# Patient Record
Sex: Female | Born: 1990 | Race: White | Hispanic: No | Marital: Single | Smoking: Current every day smoker
Health system: Southern US, Community
[De-identification: ages and names within clinical notes are randomized; demographics above are authoritative.]

## PROBLEM LIST (undated history)

## (undated) ENCOUNTER — Inpatient Hospital Stay: Payer: Self-pay

## (undated) DIAGNOSIS — F32A Depression, unspecified: Secondary | ICD-10-CM

## (undated) DIAGNOSIS — F1910 Other psychoactive substance abuse, uncomplicated: Secondary | ICD-10-CM

## (undated) DIAGNOSIS — T7840XA Allergy, unspecified, initial encounter: Secondary | ICD-10-CM

## (undated) DIAGNOSIS — D649 Anemia, unspecified: Secondary | ICD-10-CM

## (undated) DIAGNOSIS — Z789 Other specified health status: Secondary | ICD-10-CM

## (undated) DIAGNOSIS — R011 Cardiac murmur, unspecified: Secondary | ICD-10-CM

## (undated) DIAGNOSIS — F419 Anxiety disorder, unspecified: Secondary | ICD-10-CM

## (undated) HISTORY — DX: Depression, unspecified: F32.A

## (undated) HISTORY — DX: Allergy, unspecified, initial encounter: T78.40XA

## (undated) HISTORY — DX: Anxiety disorder, unspecified: F41.9

## (undated) HISTORY — DX: Anemia, unspecified: D64.9

## (undated) HISTORY — DX: Other psychoactive substance abuse, uncomplicated: F19.10

---
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
## 2011-05-13 ENCOUNTER — Ambulatory Visit: Payer: Self-pay | Admitting: Family Medicine

## 2012-12-29 ENCOUNTER — Emergency Department: Payer: Self-pay | Admitting: Emergency Medicine

## 2012-12-29 LAB — CBC
HGB: 14 g/dL (ref 12.0–16.0)
Platelet: 221 10*3/uL (ref 150–440)
RBC: 4.63 10*6/uL (ref 3.80–5.20)
RDW: 14.8 % — ABNORMAL HIGH (ref 11.5–14.5)

## 2012-12-29 LAB — COMPREHENSIVE METABOLIC PANEL
Albumin: 4.3 g/dL (ref 3.4–5.0)
Alkaline Phosphatase: 59 U/L (ref 50–136)
Anion Gap: 7 (ref 7–16)
BUN: 11 mg/dL (ref 7–18)
Calcium, Total: 9 mg/dL (ref 8.5–10.1)
Chloride: 107 mmol/L (ref 98–107)
Glucose: 111 mg/dL — ABNORMAL HIGH (ref 65–99)
Osmolality: 278 (ref 275–301)
Potassium: 4 mmol/L (ref 3.5–5.1)
SGPT (ALT): 19 U/L (ref 12–78)
Total Protein: 7.8 g/dL (ref 6.4–8.2)

## 2012-12-29 LAB — URINALYSIS, COMPLETE
Bacteria: NONE SEEN
Bilirubin,UR: NEGATIVE
Blood: NEGATIVE
Glucose,UR: NEGATIVE mg/dL (ref 0–75)
Ketone: NEGATIVE
Nitrite: NEGATIVE
Ph: 6 (ref 4.5–8.0)
RBC,UR: 1 /HPF (ref 0–5)
Squamous Epithelial: 3

## 2012-12-29 LAB — LIPASE, BLOOD: Lipase: 75 U/L (ref 73–393)

## 2013-04-17 ENCOUNTER — Encounter: Payer: Self-pay | Admitting: Maternal & Fetal Medicine

## 2013-04-24 ENCOUNTER — Emergency Department: Payer: Self-pay | Admitting: Unknown Physician Specialty

## 2013-04-24 LAB — CBC WITH DIFFERENTIAL/PLATELET
Eosinophil #: 0.2 10*3/uL (ref 0.0–0.7)
Eosinophil %: 1.9 %
HCT: 34.9 % — ABNORMAL LOW (ref 35.0–47.0)
HGB: 12.1 g/dL (ref 12.0–16.0)
Lymphocyte %: 19.9 %
Monocyte %: 6.1 %
Neutrophil #: 5.7 10*3/uL (ref 1.4–6.5)
Platelet: 163 10*3/uL (ref 150–440)
RBC: 3.77 10*6/uL — ABNORMAL LOW (ref 3.80–5.20)
RDW: 13.2 % (ref 11.5–14.5)

## 2013-04-24 LAB — COMPREHENSIVE METABOLIC PANEL
Alkaline Phosphatase: 40 U/L — ABNORMAL LOW (ref 50–136)
Anion Gap: 6 — ABNORMAL LOW (ref 7–16)
BUN: 7 mg/dL (ref 7–18)
Bilirubin,Total: 0.3 mg/dL (ref 0.2–1.0)
Calcium, Total: 8.5 mg/dL (ref 8.5–10.1)
Chloride: 108 mmol/L — ABNORMAL HIGH (ref 98–107)
Co2: 25 mmol/L (ref 21–32)
Creatinine: 0.5 mg/dL — ABNORMAL LOW (ref 0.60–1.30)
EGFR (African American): >60
EGFR (Non-African Amer.): >60
Glucose: 83 mg/dL (ref 65–99)
Osmolality: 275 (ref 275–301)
Potassium: 3.5 mmol/L (ref 3.5–5.1)
Sodium: 139 mmol/L (ref 136–145)

## 2013-04-24 LAB — URINALYSIS, COMPLETE
Bacteria: NONE SEEN
Blood: NEGATIVE
Glucose,UR: NEGATIVE mg/dL (ref 0–75)
Ketone: NEGATIVE
Nitrite: NEGATIVE
Ph: 7 (ref 4.5–8.0)
Protein: NEGATIVE
RBC,UR: <1 /HPF (ref 0–5)
Specific Gravity: 1.015 (ref 1.003–1.030)

## 2013-04-24 LAB — LIPASE, BLOOD: Lipase: 53 U/L — ABNORMAL LOW (ref 73–393)

## 2013-04-24 LAB — WET PREP, GENITAL

## 2013-05-29 ENCOUNTER — Encounter: Payer: Self-pay | Admitting: Maternal and Fetal Medicine

## 2013-07-25 ENCOUNTER — Observation Stay: Payer: Self-pay | Admitting: Advanced Practice Midwife

## 2013-07-25 LAB — URINALYSIS, COMPLETE
Bilirubin,UR: NEGATIVE
Glucose,UR: NEGATIVE mg/dL (ref 0–75)
Leukocyte Esterase: NEGATIVE
Nitrite: NEGATIVE
Ph: 7 (ref 4.5–8.0)
Protein: NEGATIVE
Specific Gravity: 1.012 (ref 1.003–1.030)

## 2013-10-25 ENCOUNTER — Observation Stay: Payer: Self-pay | Admitting: Obstetrics & Gynecology

## 2013-11-01 ENCOUNTER — Inpatient Hospital Stay: Payer: Self-pay

## 2013-11-01 LAB — GC/CHLAMYDIA PROBE AMP

## 2013-11-02 LAB — CBC WITH DIFFERENTIAL/PLATELET
BASOS ABS: 0.1 10*3/uL (ref 0.0–0.1)
Basophil %: 0.4 %
Eosinophil #: 0.1 10*3/uL (ref 0.0–0.7)
Eosinophil %: 0.7 %
HCT: 36 % (ref 35.0–47.0)
HGB: 12.3 g/dL (ref 12.0–16.0)
LYMPHS PCT: 13.7 %
Lymphocyte #: 2 10*3/uL (ref 1.0–3.6)
MCH: 31.7 pg (ref 26.0–34.0)
MCHC: 34.1 g/dL (ref 32.0–36.0)
MCV: 93 fL (ref 80–100)
MONO ABS: 0.9 x10 3/mm (ref 0.2–0.9)
Monocyte %: 6.1 %
Neutrophil #: 11.6 10*3/uL — ABNORMAL HIGH (ref 1.4–6.5)
Neutrophil %: 79.1 %
Platelet: 143 10*3/uL — ABNORMAL LOW (ref 150–440)
RBC: 3.87 10*6/uL (ref 3.80–5.20)
RDW: 13 % (ref 11.5–14.5)
WBC: 14.7 10*3/uL — AB (ref 3.6–11.0)

## 2013-11-04 LAB — HEMATOCRIT: HCT: 28.1 % — AB (ref 35.0–47.0)

## 2013-12-27 ENCOUNTER — Emergency Department: Payer: Self-pay | Admitting: Emergency Medicine

## 2013-12-28 LAB — URINALYSIS, COMPLETE
BACTERIA: NONE SEEN
Bilirubin,UR: NEGATIVE
Blood: NEGATIVE
Glucose,UR: NEGATIVE mg/dL (ref 0–75)
KETONE: NEGATIVE
NITRITE: NEGATIVE
Ph: 5 (ref 4.5–8.0)
Protein: NEGATIVE
RBC,UR: 2 /HPF (ref 0–5)
Specific Gravity: 1.027 (ref 1.003–1.030)
WBC UR: 2 /HPF (ref 0–5)

## 2013-12-28 LAB — COMPREHENSIVE METABOLIC PANEL
ALBUMIN: 3.8 g/dL (ref 3.4–5.0)
ALT: 17 U/L (ref 12–78)
ANION GAP: 6 — AB (ref 7–16)
Alkaline Phosphatase: 51 U/L
BILIRUBIN TOTAL: 0.2 mg/dL (ref 0.2–1.0)
BUN: 14 mg/dL (ref 7–18)
Calcium, Total: 9.2 mg/dL (ref 8.5–10.1)
Chloride: 109 mmol/L — ABNORMAL HIGH (ref 98–107)
Co2: 27 mmol/L (ref 21–32)
Creatinine: 1.08 mg/dL (ref 0.60–1.30)
EGFR (African American): >60
GLUCOSE: 88 mg/dL (ref 65–99)
Osmolality: 283 (ref 275–301)
Potassium: 3.9 mmol/L (ref 3.5–5.1)
SGOT(AST): 15 U/L (ref 15–37)
SODIUM: 142 mmol/L (ref 136–145)
TOTAL PROTEIN: 7.7 g/dL (ref 6.4–8.2)

## 2013-12-28 LAB — CBC WITH DIFFERENTIAL/PLATELET
BASOS PCT: 0.4 %
Basophil #: 0 10*3/uL (ref 0.0–0.1)
EOS ABS: 0.1 10*3/uL (ref 0.0–0.7)
Eosinophil %: 1.2 %
HCT: 40.1 % (ref 35.0–47.0)
HGB: 12.6 g/dL (ref 12.0–16.0)
LYMPHS ABS: 2.4 10*3/uL (ref 1.0–3.6)
Lymphocyte %: 21.7 %
MCH: 29 pg (ref 26.0–34.0)
MCHC: 31.3 g/dL — ABNORMAL LOW (ref 32.0–36.0)
MCV: 93 fL (ref 80–100)
MONOS PCT: 7.7 %
Monocyte #: 0.9 x10 3/mm (ref 0.2–0.9)
NEUTROS ABS: 7.8 10*3/uL — AB (ref 1.4–6.5)
Neutrophil %: 69 %
Platelet: 237 10*3/uL (ref 150–440)
RBC: 4.34 10*6/uL (ref 3.80–5.20)
RDW: 12.7 % (ref 11.5–14.5)
WBC: 11.2 10*3/uL — ABNORMAL HIGH (ref 3.6–11.0)

## 2013-12-28 LAB — LIPASE, BLOOD: Lipase: 84 U/L (ref 73–393)

## 2014-05-12 HISTORY — PX: WISDOM TOOTH EXTRACTION: SHX21

## 2015-01-06 IMAGING — US ABDOMEN ULTRASOUND LIMITED
1 series · 14 of 25 positions shown · non-contrast
Comparison: Abdominal ultrasound 05/13/2011.

CLINICAL DATA: Right upper quadrant pain and nausea.

EXAM:
US ABDOMEN LIMITED - RIGHT UPPER QUADRANT

[Series 1: abdomen ultrasound limited · 0.24mm/px · 14 of 35 slices shown]
[im 1/35]
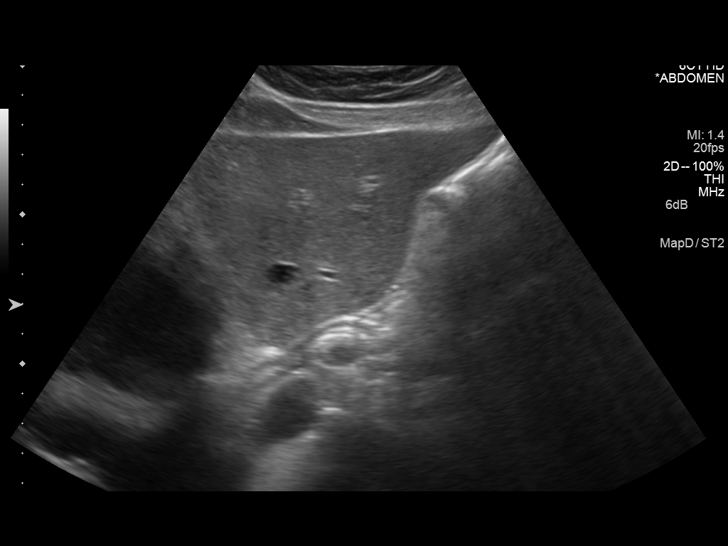
[im 3/35]
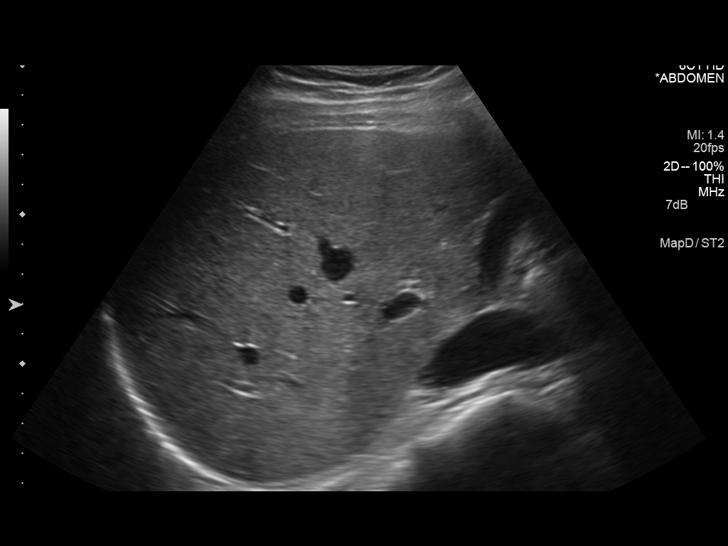
[im 6/35]
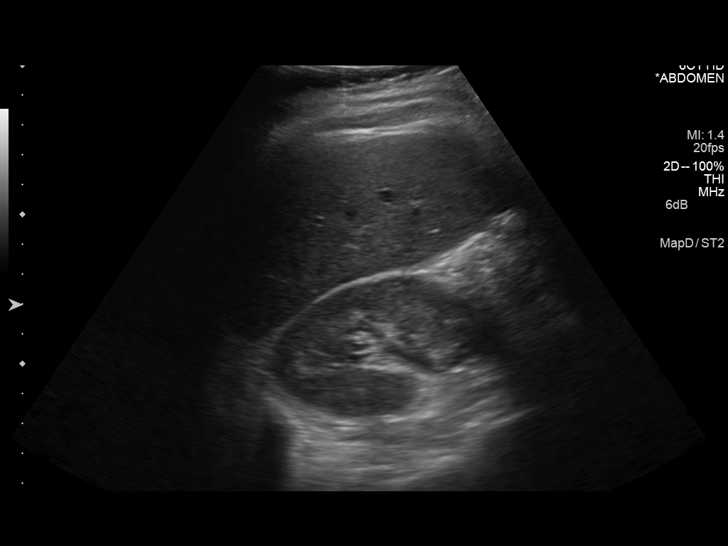
[im 9/35]
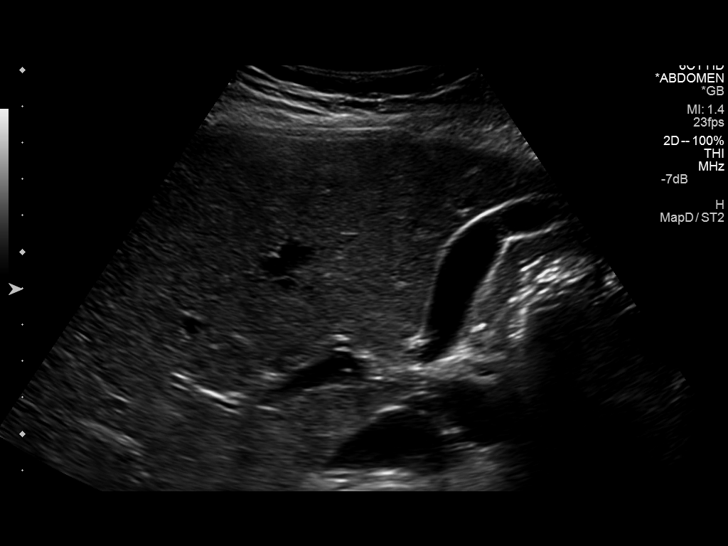
[im 12/35]
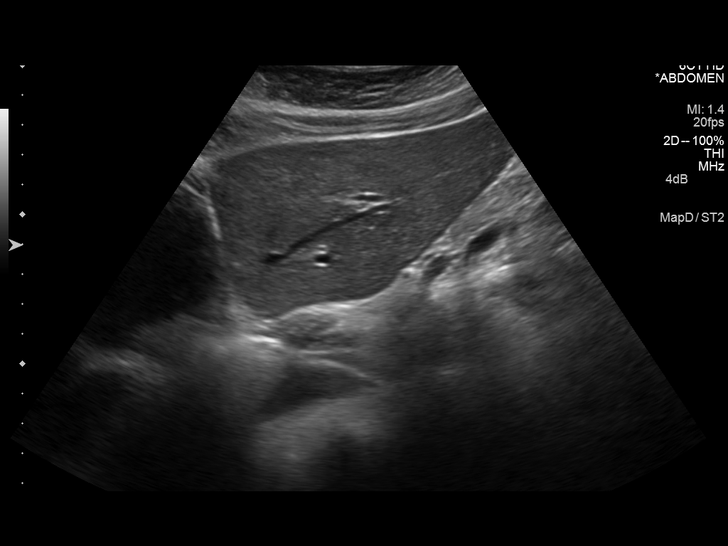
[im 13/35]
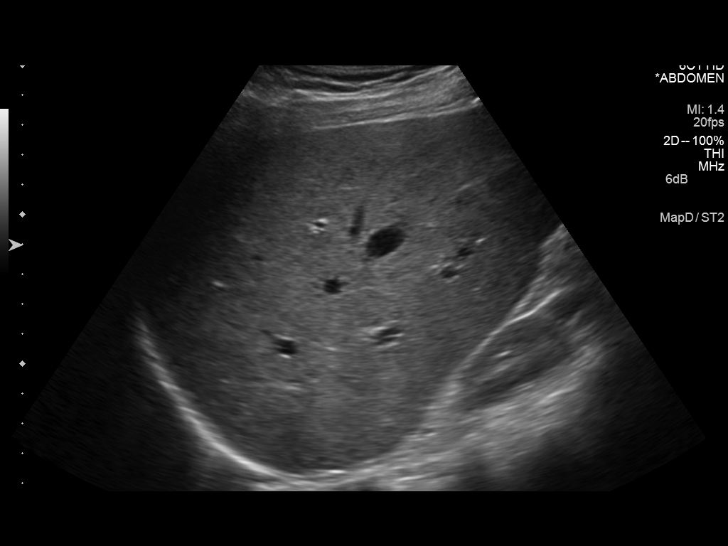
[im 16/35]
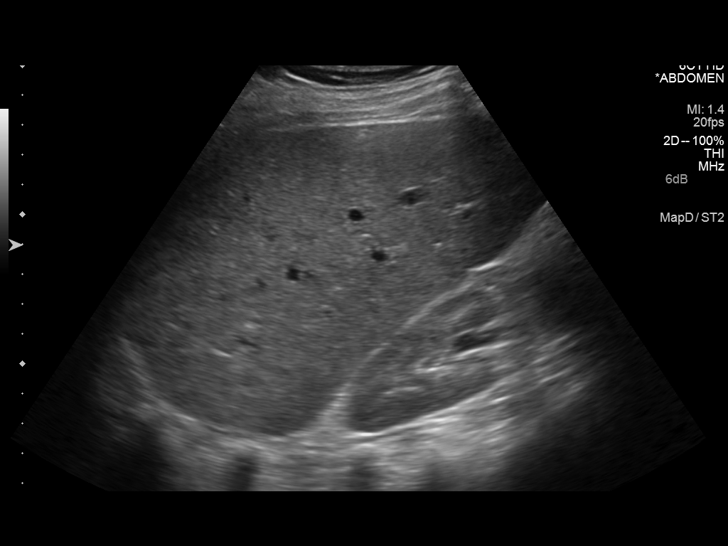
[im 19/35]
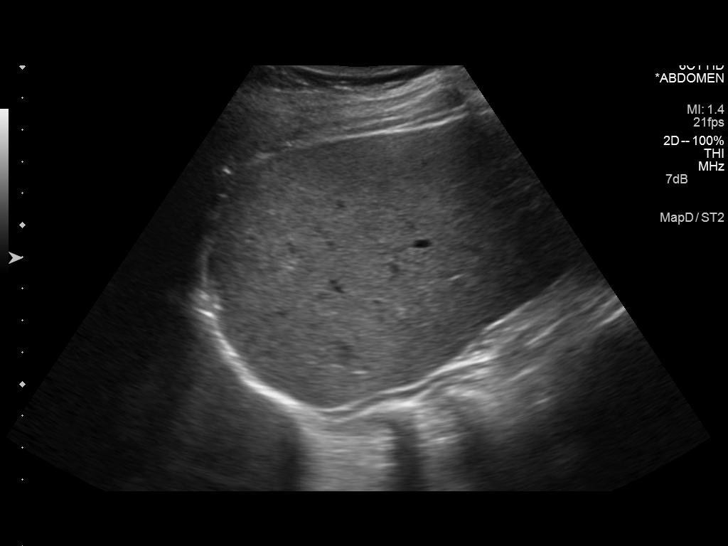
[im 22/35]
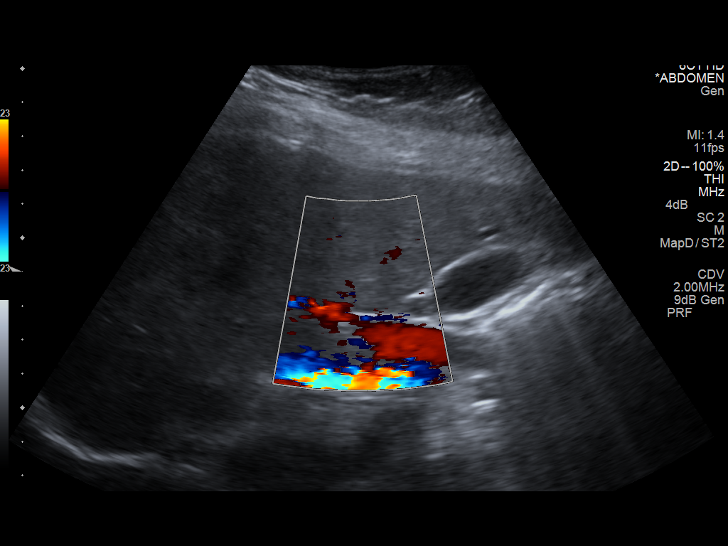
[im 23/35]
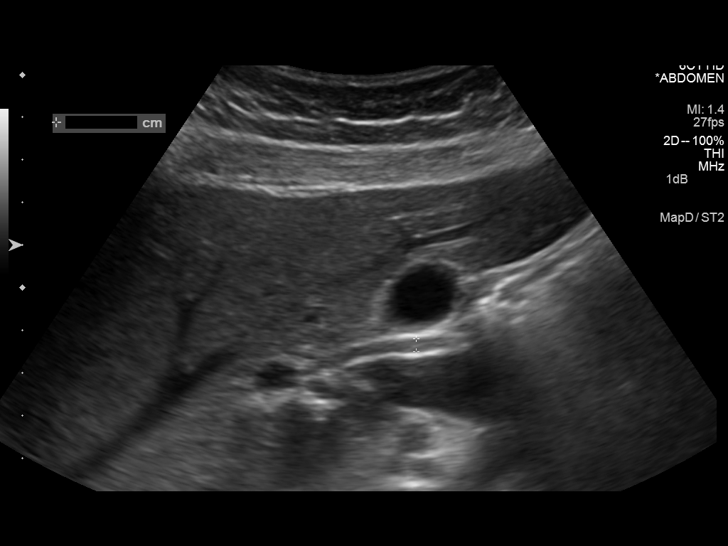
[im 26/35]
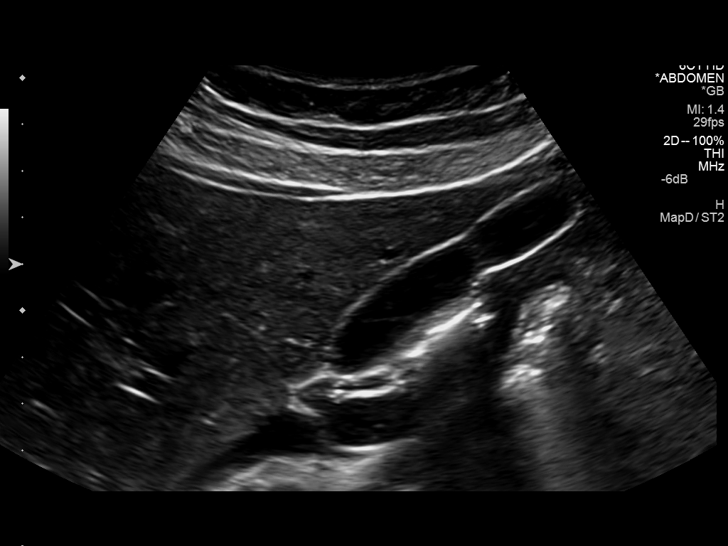
[im 29/35]
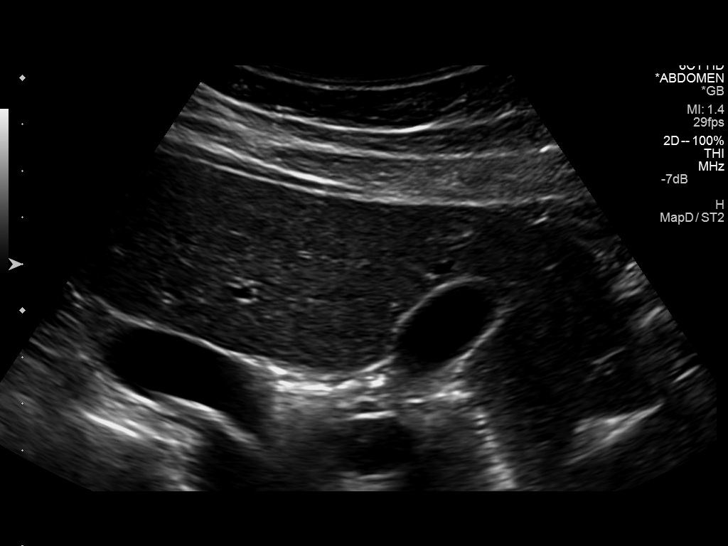
[im 32/35]
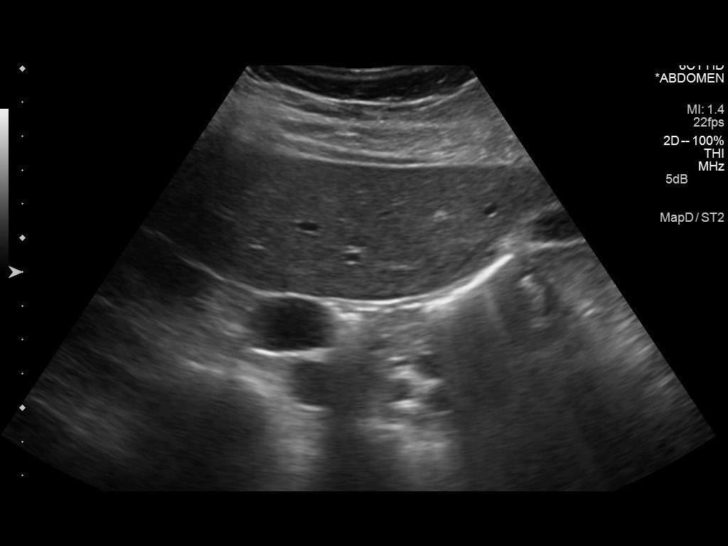
[im 35/35]
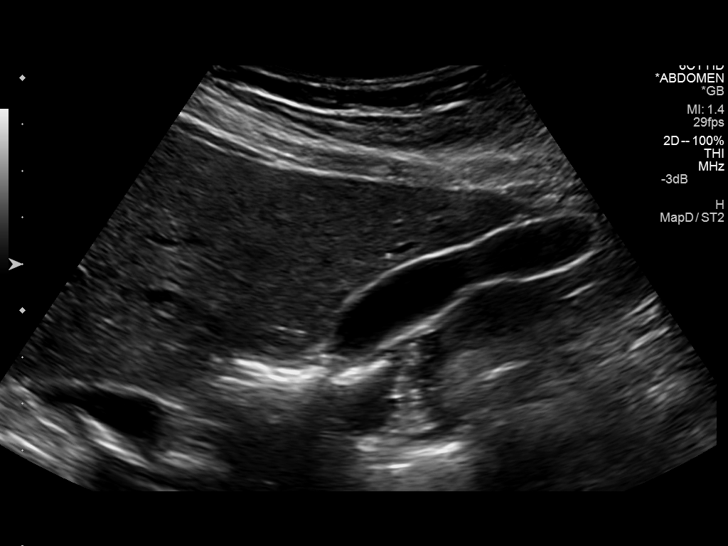

[14 of 25 positions shown; findings below may reference images not displayed]

FINDINGS: Gallbladder:

No gallstones or wall thickening visualized. No sonographic Murphy
sign noted.

Common bile duct:

Diameter: 0.3 cm

Liver:

No focal lesion identified. Within normal limits in parenchymal
echogenicity.
IMPRESSION: Negative for gallstones.  Negative examination.

## 2015-02-19 NOTE — H&P (Signed)
L&D Evaluation:  History:  HPI 24 year old G1 P0 with EDC=10/25/2013 by LMP=01/18/13 and confirmed by a 12wk2d ultrasound (EDC=10/28/2013) admitted for IOL at 41 weeks. Reports active FM. No VB or LOF. PNC at [facility] remarkable for early care, tobacco use (1/2 PPD), being RH negative (received Rhogam 08/04/2013, and more recently was diagnosed with polyhydraminos. Received TDAP 08/04/2013. LABS: A neg, VI, Rubella UTD, GBS negative.   Presents with IOL   Patient's Medical History No Chronic Illness   Patient's Surgical History none   Medications Pre Natal Vitamins  Tylenol (Acetaminophen)  TUMS   Allergies Sulfa, H2O2   Social History tobacco  1/2 PPD, used MJ prior to pregnancy.   Family History Non-Contributory   ROS:  ROS see HPI   Exam:  Vital Signs stable  129/71   Urine Protein not completed   General no apparent distress   Mental Status clear   Chest clear   Heart normal sinus rhythm, no murmur/gallop/rubs   Abdomen gravid, non-tender   Estimated Fetal Weight Average for gestational age   Fetal Position ROP   Edema no edema   Reflexes 2+   Pelvic no external lesions, 1/60%/-1 to -2   Mebranes Intact, AFI=25 cm   FHT normal rate with no decels, 150 baseline initially with accels to 170s   FHT Description Cat 1, reactive NST   Fetal Heart Rate 150   Ucx occ   Skin dry   Impression:  Impression IUP at 41 weeks with reactive NST and polyhydraminos   Plan:  Plan Discussed risks of induction vs risks of continued observation. Aware of risjks of hyperstimulation, FITL, failed induction,  C-section and needing serial induction.  Discussed use of Cervidil, in cervical ripening as well as Cytotec, intracervical foley, and Pitocin. patient and husband wish to proceed with IOL. Cervidil inserted PV.   Electronic Signatures: Trinna BalloonGutierrez, Aylan Bayona L (CNM)  (Signed 21-Jan-15 23:43)  Authored: L&D Evaluation   Last Updated: 21-Jan-15 23:43 by Trinna BalloonGutierrez, Carlitos Bottino  L (CNM)

## 2015-08-28 ENCOUNTER — Other Ambulatory Visit: Payer: Self-pay | Admitting: Advanced Practice Midwife

## 2015-08-28 DIAGNOSIS — Z369 Encounter for antenatal screening, unspecified: Secondary | ICD-10-CM

## 2015-09-23 ENCOUNTER — Ambulatory Visit (HOSPITAL_BASED_OUTPATIENT_CLINIC_OR_DEPARTMENT_OTHER)
Admission: RE | Admit: 2015-09-23 | Discharge: 2015-09-23 | Disposition: A | Discharge status: Home / Self Care | Payer: Medicaid Other | Source: Ambulatory Visit | Attending: Maternal & Fetal Medicine | Admitting: Maternal & Fetal Medicine

## 2015-09-23 ENCOUNTER — Ambulatory Visit
Admission: RE | Admit: 2015-09-23 | Discharge: 2015-09-23 | Disposition: A | Discharge status: Home / Self Care | Payer: Medicaid Other | Source: Ambulatory Visit | Attending: Maternal & Fetal Medicine | Admitting: Maternal & Fetal Medicine

## 2015-09-23 DIAGNOSIS — Z3A12 12 weeks gestation of pregnancy: Secondary | ICD-10-CM | POA: Diagnosis not present

## 2015-09-23 DIAGNOSIS — Z369 Encounter for antenatal screening, unspecified: Secondary | ICD-10-CM | POA: Insufficient documentation

## 2015-09-23 DIAGNOSIS — Z36 Encounter for antenatal screening of mother: Secondary | ICD-10-CM

## 2015-09-23 HISTORY — DX: Other specified health status: Z78.9

## 2015-09-23 NOTE — Progress Notes (Addendum)
Referring physician:  [facility] Department Length of Consultation: 30 minutes   Pamela [name]. Pamela [name]  was referred to [facility] for genetic counseling to review prenatal screening and testing options.  This note summarizes the information we discussed.    We offered the following routine screening tests for this pregnancy:  First trimester screening, which includes nuchal translucency ultrasound screen and first trimester maternal serum marker screening.  The nuchal translucency has approximately an 80% detection rate for Down syndrome and can be positive for other chromosome abnormalities as well as congenital heart defects.  When combined with a maternal serum marker screening, the detection rate is up to 90% for Down syndrome and up to 97% for trisomy 18.     Maternal serum marker screening, a blood test that measures pregnancy proteins, can provide risk assessments for Down syndrome, trisomy 18, and open neural tube defects (spina bifida, anencephaly). Because it does not directly examine the fetus, it cannot positively diagnose or rule out these problems.  Targeted ultrasound uses high frequency sound waves to create an image of the developing fetus.  An ultrasound is often recommended as a routine means of evaluating the pregnancy.  It is also used to screen for fetal anatomy problems (for example, a heart defect) that might be suggestive of a chromosomal or other abnormality.   Should these screening tests indicate an increased concern, then the following additional testing options would be offered:  The chorionic villus sampling procedure is available for first trimester chromosome analysis.  This involves the withdrawal of a small amount of chorionic villi (tissue from the developing placenta).  Risk of pregnancy loss is estimated to be approximately 1 in 200 to 1 in 100 (0.5 to 1%).  There is approximately a 1% (1 in 100) chance that the CVS chromosome results will  be unclear.  Chorionic villi cannot be tested for neural tube defects.     Amniocentesis involves the removal of a small amount of amniotic fluid from the sac surrounding the fetus with the use of a thin needle inserted through the maternal abdomen and uterus.  Ultrasound guidance is used throughout the procedure.  Fetal cells from amniotic fluid are directly evaluated and > 99.5% of chromosome problems and > 98% of open neural tube defects can be detected. This procedure is generally performed after the 15th week of pregnancy.  The main risks to this procedure include complications leading to miscarriage in less than 1 in 200 cases (0.5%).  As another option for information if the pregnancy is suspected to be an an increased chance for certain chromosome conditions, we also reviewed the availability of cell free fetal DNA testing from maternal blood to determine whether or not the baby may have either Down syndrome, trisomy 79, or trisomy 84.  This test utilizes a maternal blood sample and DNA sequencing technology to isolate circulating cell free fetal DNA from maternal plasma.  The fetal DNA can then be analyzed for DNA sequences that are derived from the three most common chromosomes involved in aneuploidy, chromosomes 13, 18, and 21.  If the overall amount of DNA is greater than the expected level for any of these chromosomes, aneuploidy is suspected.  While we do not consider it a replacement for invasive testing and karyotype analysis, a negative result from this testing would be reassuring, though not a guarantee of a normal chromosome complement for the baby.  An abnormal result is certainly suggestive of an abnormal chromosome complement,  though we would still recommend CVS or amniocentesis to confirm any findings from this testing.   Cystic Fibrosis screening was also discussed with the patient. Cystic fibrosis (CF) is one of the most common genetic conditions in persons of Caucasian ancestry.   This condition occurs in approximately 1 in 2,500 Caucasian persons and results in thickened secretions in the lungs, digestive, and reproductive systems.  For a baby to be at risk for having CF, both of the parents must be carriers for this condition.  Approximately 1 in 15 Caucasian persons is a carrier for CF.  Current carrier testing looks for the most common mutations in the gene for CF and can detect approximately 90% of carriers in the Caucasian population.  This means that the carrier screening can greatly reduce, but cannot eliminate, the chance for an individual to have a child with CF.  If an individual is found to be a carrier for CF, then carrier testing would be available for the partner. As part of [name] newborn screening profile, all babies born in the state of [name] will have a two-tier screening process.  Specimens are first tested to determine the concentration of immunoreactive trypsinogen (IRT).  The top 5% of specimens with the highest IRT values then undergo DNA testing using a panel of over 40 common CF mutations.   We obtained a detailed family history and pregnancy history.  Pamela [name] reported that Pamela [name] has a daughter with a congenital heart defect.  She had surgery as a child and is otherwise in good health with normal development.  She does have a diagnosis of schizophrenia. There are no other family members reported to have heart defects or mental health diagnoses.  We reviewed that heart conditions are among the most common birth defects and may be present as part of many genetic syndromes, or may occur as an isolated birth defect. In the absence of a known genetic syndrome, they are thought to be multifactorial, or due to a combination of genetic and other factors.  In a third degree relative, the chance for a structural heart defect is expected to be less than 1%.  The heart anatomy can be assessed at the 18 week anatomy ultrasound.  If concerns are  noted, a fetal echocardiogram may be offered.  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 There were complications during birth that were thought to explain her condition.  If this is the case, then other family members would not be expected to be at increased risk.  The remainder of the family history was reported to be unremarkable for birth defects, mental retardation, recurrent pregnancy loss or known chromosome abnormalities.  Pamela [name] stated that this is her second pregnancy. She and her partner, Pamela [name], have a healthy daughter who is almost 65 years old. In the current pregnancy, she reported no complications.  She did report drinking alcohol and smoking marijuana prior to learning that she was pregnant.  This was one occasion during the all or none period and would not be expected to have a significant impact on the risk for birth defects. She reported no other exposures in the pregnancy.  After consideration of the options, Pamela [name] elected to proceed with first trimester screening today and to decline CF carrier testing.  An ultrasound was performed at the time of the visit.  The gestational age was consistent with  12 weeks.  Fetal anatomy could not be assessed due to  early gestational age.  Please refer to the ultrasound report for details of that study.  Pamela [name] was encouraged to call with questions or concerns.  We can be contacted at [phone].   Pamela Andersoneborah F. Wells, Pamela [name], Pamela [name]  I was immediately available and supervising. Pamela PonderAndra H. Merlin Golden, MD [facility]

## 2015-09-26 ENCOUNTER — Telehealth: Payer: Self-pay | Admitting: Genetics

## 2015-09-26 NOTE — Telephone Encounter (Signed)
  The results of the First Trimester Nuchal Translucency and Biochemical Screening performed on 09/23/15, are now available.  These results showed an increased risk for Trisomy 21.  Specifically, the risk for Trisomy 21 is estimated to be 1 in 4248 (the prior risk based upon age alone was 1 in 611,029).   The risk for Trisomy 13/18 is now estimated to be 1 in 2,004.  We were unable to reach the patient by phone directly and left a message requesting a call back to schedule follow-up genetic counseling and appropriate testing.

## 2015-09-30 ENCOUNTER — Telehealth: Payer: Self-pay | Admitting: Obstetrics and Gynecology

## 2015-09-30 NOTE — Telephone Encounter (Signed)
The results of the First Trimester Nuchal Translucency and Biochemical Screening are now available.  These results showed an increased risk for Down Syndrome, which was discussed with Pamela [name] by phone today.  Specifically, the risk for Down syndrome is now 1 in 50248 (the prior risk based upon age alone was 1 in 591,029).  The risk for Trisomy 13/18 is estimated to be 1 in 2,004.  As we discussed when we called with these results, if more definitive information is desired, we would offer chorionic villus sampling or amniocentesis.  Because we do not yet know the effectiveness of combined first and second trimester screening, we do not recommend a maternal serum screen to assess the chance for chromosome conditions.  However, if screening for neural tube defects is desired, maternal serum screening for AFP only can be performed between 15 and [redacted] weeks gestation.    We also reviewed the availability of cell free fetal DNA testing from maternal blood to determine whether or not the baby may have either Down syndrome, trisomy 4713, or trisomy 7918.  This test utilizes a maternal blood sample and DNA sequencing technology to isolate circulating cell free fetal DNA from maternal plasma.  The fetal DNA can then be analyzed for DNA sequences that are derived from the three most common chromosomes involved in aneuploidy, chromosomes 13, 18, and 21.  If the overall amount of DNA is greater than the expected level for any of these chromosomes, aneuploidy is suspected.  While we do not consider it a replacement for invasive testing and karyotype analysis, a negative result from this testing would be reassuring, though not a guarantee of a normal chromosome complement for the baby.  An abnormal result is certainly suggestive of an abnormal chromosome complement, though we would still recommend CVS or amniocentesis to confirm any findings from this testing  The patient elected to proceed with the detailed anatomy ultrasound  that is scheduled for November 11, 2015 at 11am.  She declines invasive prenatal diagnosis due to the risks, and may consider cell free fetal DNA testing based upon her feelings after the anatomy ultrasound.  She is clear that this information would not change the course of the pregnancy for she and her family.   We encouraged Pamela [name] to contact us with any questions prior to her ultrasound visit if desired. We may be reached at [phone].

## 2015-11-11 ENCOUNTER — Ambulatory Visit
Admission: RE | Admit: 2015-11-11 | Discharge: 2015-11-11 | Disposition: A | Discharge status: Home / Self Care | Payer: Medicaid Other | Source: Ambulatory Visit | Attending: Obstetrics & Gynecology | Admitting: Obstetrics & Gynecology

## 2015-11-11 ENCOUNTER — Other Ambulatory Visit: Payer: Self-pay

## 2015-11-11 DIAGNOSIS — Z36 Encounter for antenatal screening of mother: Secondary | ICD-10-CM | POA: Diagnosis present

## 2015-11-11 DIAGNOSIS — O359XX0 Maternal care for (suspected) fetal abnormality and damage, unspecified, not applicable or unspecified: Secondary | ICD-10-CM

## 2015-11-11 DIAGNOSIS — Z3A19 19 weeks gestation of pregnancy: Secondary | ICD-10-CM | POA: Insufficient documentation

## 2015-12-19 ENCOUNTER — Other Ambulatory Visit: Payer: Self-pay

## 2015-12-19 DIAGNOSIS — O285 Abnormal chromosomal and genetic finding on antenatal screening of mother: Secondary | ICD-10-CM

## 2015-12-28 ENCOUNTER — Telehealth: Payer: Self-pay

## 2015-12-28 NOTE — Telephone Encounter (Signed)
Pamela [name] called to L&D floor complaining of vaginal pain since last night that is intermittent.  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  States pain is made better when she sits down, but is worse when she stands up.  Pain is an aching/pressure type pain in vagina.  Denies any bleeding.  States she has felt baby moving well.  Denies contractions.  Discussed with patient that she is welcome to come up to L&D for assessment.  Also discussed option of patient asking her manager if there is a stool for her to sit on while she is at [employer].  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  Goes to [facility] for care.  Encouraged patient to f/u with them on Monday if she does not present to L&D this weekend.

## 2016-01-16 ENCOUNTER — Ambulatory Visit
Admission: RE | Admit: 2016-01-16 | Discharge: 2016-01-16 | Disposition: A | Discharge status: Home / Self Care | Payer: Medicaid Other | Source: Ambulatory Visit | Attending: Maternal & Fetal Medicine | Admitting: Maternal & Fetal Medicine

## 2016-01-16 DIAGNOSIS — Z3A29 29 weeks gestation of pregnancy: Secondary | ICD-10-CM | POA: Insufficient documentation

## 2016-01-16 DIAGNOSIS — Q909 Down syndrome, unspecified: Secondary | ICD-10-CM | POA: Insufficient documentation

## 2016-01-16 DIAGNOSIS — O285 Abnormal chromosomal and genetic finding on antenatal screening of mother: Secondary | ICD-10-CM | POA: Insufficient documentation

## 2016-01-16 DIAGNOSIS — Z36 Encounter for antenatal screening of mother: Secondary | ICD-10-CM | POA: Diagnosis present

## 2016-01-16 HISTORY — DX: Cardiac murmur, unspecified: R01.1

## 2016-02-18 ENCOUNTER — Inpatient Hospital Stay
Admission: EM | Admit: 2016-02-18 | Discharge: 2016-02-18 | Disposition: A | Discharge status: Home / Self Care | Payer: Medicaid Other | Attending: Obstetrics and Gynecology | Admitting: Obstetrics and Gynecology

## 2016-02-18 DIAGNOSIS — Z3493 Encounter for supervision of normal pregnancy, unspecified, third trimester: Secondary | ICD-10-CM | POA: Insufficient documentation

## 2016-02-18 NOTE — Discharge Instructions (Signed)
Call provider or return to birthplace with: ? ?1. Regular contractions ?2. Leaking of fluid from your vagina ?3. Vaginal bleeding: Bright red or heavy like a period ?4. Decreased Fetal movement  ?

## 2016-03-12 HISTORY — PX: TUBAL LIGATION: SHX77

## 2016-04-02 ENCOUNTER — Ambulatory Visit
Admission: RE | Admit: 2016-04-02 | Discharge: 2016-04-02 | Disposition: A | Discharge status: Home / Self Care | Payer: Medicaid Other | Source: Ambulatory Visit | Attending: Obstetrics & Gynecology | Admitting: Obstetrics & Gynecology

## 2016-04-02 VITALS — BP 140/87 | HR 105 | Temp 98.5°F | Resp 20 | Wt 195.0 lb

## 2016-04-02 DIAGNOSIS — O289 Unspecified abnormal findings on antenatal screening of mother: Secondary | ICD-10-CM | POA: Insufficient documentation

## 2016-04-02 DIAGNOSIS — Z3A40 40 weeks gestation of pregnancy: Secondary | ICD-10-CM | POA: Insufficient documentation

## 2016-04-06 ENCOUNTER — Inpatient Hospital Stay
Admission: EM | Admit: 2016-04-06 | Discharge: 2016-04-09 | DRG: 765 | Disposition: A | Discharge status: Home / Self Care | Payer: Medicaid Other | Attending: Obstetrics and Gynecology | Admitting: Obstetrics and Gynecology

## 2016-04-06 ENCOUNTER — Encounter
Admission: EM | Disposition: A | Discharge status: Home / Self Care | Payer: Self-pay | Source: Home / Self Care | Attending: Obstetrics and Gynecology

## 2016-04-06 ENCOUNTER — Inpatient Hospital Stay: Payer: Medicaid Other | Admitting: Anesthesiology

## 2016-04-06 DIAGNOSIS — F17200 Nicotine dependence, unspecified, uncomplicated: Secondary | ICD-10-CM | POA: Diagnosis present

## 2016-04-06 DIAGNOSIS — F1290 Cannabis use, unspecified, uncomplicated: Secondary | ICD-10-CM | POA: Diagnosis present

## 2016-04-06 DIAGNOSIS — O99314 Alcohol use complicating childbirth: Secondary | ICD-10-CM | POA: Diagnosis present

## 2016-04-06 DIAGNOSIS — O99334 Smoking (tobacco) complicating childbirth: Secondary | ICD-10-CM | POA: Diagnosis present

## 2016-04-06 DIAGNOSIS — Z302 Encounter for sterilization: Secondary | ICD-10-CM

## 2016-04-06 DIAGNOSIS — Z6711 Type A blood, Rh negative: Secondary | ICD-10-CM

## 2016-04-06 DIAGNOSIS — O3663X0 Maternal care for excessive fetal growth, third trimester, not applicable or unspecified: Principal | ICD-10-CM | POA: Diagnosis present

## 2016-04-06 DIAGNOSIS — D62 Acute posthemorrhagic anemia: Secondary | ICD-10-CM | POA: Diagnosis present

## 2016-04-06 DIAGNOSIS — O480 Post-term pregnancy: Secondary | ICD-10-CM | POA: Diagnosis present

## 2016-04-06 DIAGNOSIS — O99324 Drug use complicating childbirth: Secondary | ICD-10-CM | POA: Diagnosis present

## 2016-04-06 DIAGNOSIS — O9902 Anemia complicating childbirth: Secondary | ICD-10-CM | POA: Diagnosis present

## 2016-04-06 DIAGNOSIS — Z3A40 40 weeks gestation of pregnancy: Secondary | ICD-10-CM

## 2016-04-06 DIAGNOSIS — O1214 Gestational proteinuria, complicating childbirth: Secondary | ICD-10-CM | POA: Diagnosis present

## 2016-04-06 LAB — URINE DRUG SCREEN, QUALITATIVE (ARMC ONLY)
AMPHETAMINES, UR SCREEN: NOT DETECTED
BENZODIAZEPINE, UR SCRN: NOT DETECTED
Barbiturates, Ur Screen: NOT DETECTED
Cannabinoid 50 Ng, Ur ~~LOC~~: NOT DETECTED
Cocaine Metabolite,Ur ~~LOC~~: NOT DETECTED
MDMA (Ecstasy)Ur Screen: NOT DETECTED
METHADONE SCREEN, URINE: NOT DETECTED
Opiate, Ur Screen: NOT DETECTED
Phencyclidine (PCP) Ur S: NOT DETECTED
TRICYCLIC, UR SCREEN: NOT DETECTED

## 2016-04-06 LAB — COMPREHENSIVE METABOLIC PANEL
ALBUMIN: 3 g/dL — AB (ref 3.5–5.0)
ALT: 8 U/L — ABNORMAL LOW (ref 14–54)
ANION GAP: 9 (ref 5–15)
AST: 16 U/L (ref 15–41)
Alkaline Phosphatase: 139 U/L — ABNORMAL HIGH (ref 38–126)
BILIRUBIN TOTAL: 0.8 mg/dL (ref 0.3–1.2)
BUN: 11 mg/dL (ref 6–20)
CALCIUM: 8.8 mg/dL — AB (ref 8.9–10.3)
CO2: 19 mmol/L — AB (ref 22–32)
Chloride: 108 mmol/L (ref 101–111)
Creatinine, Ser: 0.37 mg/dL — ABNORMAL LOW (ref 0.44–1.00)
GFR calc Af Amer: >60 mL/min (ref >60)
GLUCOSE: 66 mg/dL (ref 65–99)
POTASSIUM: 3.6 mmol/L (ref 3.5–5.1)
Sodium: 136 mmol/L (ref 135–145)
Total Protein: 6.6 g/dL (ref 6.5–8.1)

## 2016-04-06 LAB — CBC
HEMATOCRIT: 32.8 % — AB (ref 35.0–47.0)
Hemoglobin: 11.7 g/dL — ABNORMAL LOW (ref 12.0–16.0)
MCH: 31.8 pg (ref 26.0–34.0)
MCHC: 35.7 g/dL (ref 32.0–36.0)
MCV: 89.1 fL (ref 80.0–100.0)
PLATELETS: 171 10*3/uL (ref 150–440)
RBC: 3.68 MIL/uL — ABNORMAL LOW (ref 3.80–5.20)
RDW: 12.8 % (ref 11.5–14.5)
WBC: 9.1 10*3/uL (ref 3.6–11.0)

## 2016-04-06 LAB — CHLAMYDIA/NGC RT PCR (ARMC ONLY)
Chlamydia Tr: NOT DETECTED
N GONORRHOEAE: NOT DETECTED

## 2016-04-06 LAB — TYPE AND SCREEN
ABO/RH(D): A NEG
ANTIBODY SCREEN: NEGATIVE

## 2016-04-06 LAB — PROTEIN / CREATININE RATIO, URINE
Creatinine, Urine: 224 mg/dL
PROTEIN CREATININE RATIO: 0.23 mg/mg{creat} — AB (ref 0.00–0.15)
TOTAL PROTEIN, URINE: 52 mg/dL

## 2016-04-06 LAB — URIC ACID: Uric Acid, Serum: 4.1 mg/dL (ref 2.3–6.6)

## 2016-04-06 SURGERY — Surgical Case
Anesthesia: Spinal | Site: Abdomen | Wound class: Clean Contaminated

## 2016-04-06 MED ORDER — ONDANSETRON HCL 4 MG/2ML IJ SOLN: 4.0000 mg | Freq: Once | INTRAMUSCULAR | Status: DC | PRN

## 2016-04-06 MED ORDER — BUPIVACAINE IN DEXTROSE 0.75-8.25 % IT SOLN
INTRATHECAL | Status: DC | PRN
  Administered 2016-04-06: 1.6 mL via INTRATHECAL

## 2016-04-06 MED ORDER — CEFAZOLIN SODIUM-DEXTROSE 2-4 GM/100ML-% IV SOLN
2.0000 g | Freq: Once | INTRAVENOUS | Status: AC
  Administered 2016-04-06 (×2): 2 g via INTRAVENOUS
  Filled 2016-04-06: qty 100

## 2016-04-06 MED ORDER — MORPHINE SULFATE (PF) 0.5 MG/ML IJ SOLN
INTRAMUSCULAR | Status: DC | PRN
  Administered 2016-04-06: .2 mg via EPIDURAL

## 2016-04-06 MED ORDER — BUPIVACAINE 0.25 % ON-Q PUMP DUAL CATH 300 ML
300.0000 mL | INJECTION | Status: DC
  Filled 2016-04-06: qty 300

## 2016-04-06 MED ORDER — BUPIVACAINE HCL (PF) 0.5 % IJ SOLN
INTRAMUSCULAR | Status: DC | PRN
  Administered 2016-04-06: 10 mL

## 2016-04-06 MED ORDER — NALBUPHINE HCL 10 MG/ML IJ SOLN: 5.0000 mg | Freq: Once | INTRAMUSCULAR | Status: DC | PRN

## 2016-04-06 MED ORDER — LACTATED RINGERS IV SOLN
INTRAVENOUS | Status: DC
  Administered 2016-04-06: 14:00:00 via INTRAVENOUS

## 2016-04-06 MED ORDER — FENTANYL CITRATE (PF) 100 MCG/2ML IJ SOLN: 25.0000 ug | INTRAMUSCULAR | Status: DC | PRN

## 2016-04-06 MED ORDER — DIPHENHYDRAMINE HCL 25 MG PO CAPS: 25.0000 mg | ORAL_CAPSULE | ORAL | Status: DC | PRN

## 2016-04-06 MED ORDER — NALOXONE HCL 0.4 MG/ML IJ SOLN: 0.4000 mg | INTRAMUSCULAR | Status: DC | PRN

## 2016-04-06 MED ORDER — BUPIVACAINE 0.25 % ON-Q PUMP DUAL CATH 400 ML: 400.0000 mL | INJECTION | Status: DC

## 2016-04-06 MED ORDER — FENTANYL CITRATE (PF) 100 MCG/2ML IJ SOLN
INTRAMUSCULAR | Status: DC | PRN
  Administered 2016-04-06 (×2): 50 ug via INTRAVENOUS

## 2016-04-06 MED ORDER — IBUPROFEN 600 MG PO TABS
600.0000 mg | ORAL_TABLET | Freq: Four times a day (QID) | ORAL | Status: DC | PRN
  Filled 2016-04-06 (×4): qty 1

## 2016-04-06 MED ORDER — LACTATED RINGERS IV SOLN: 500.0000 mL | INTRAVENOUS | Status: DC | PRN

## 2016-04-06 MED ORDER — NALBUPHINE HCL 10 MG/ML IJ SOLN: 5.0000 mg | INTRAMUSCULAR | Status: DC | PRN

## 2016-04-06 MED ORDER — ACETAMINOPHEN 325 MG PO TABS: 650.0000 mg | ORAL_TABLET | ORAL | Status: DC | PRN

## 2016-04-06 MED ORDER — NALOXONE HCL 2 MG/2ML IJ SOSY
1.0000 ug/kg/h | PREFILLED_SYRINGE | INTRAVENOUS | Status: DC | PRN
  Filled 2016-04-06: qty 2

## 2016-04-06 MED ORDER — BUPIVACAINE HCL (PF) 0.5 % IJ SOLN
INTRAMUSCULAR | Status: AC
  Filled 2016-04-06: qty 30

## 2016-04-06 MED ORDER — LIDOCAINE HCL (PF) 1 % IJ SOLN: 30.0000 mL | INTRAMUSCULAR | Status: DC | PRN

## 2016-04-06 MED ORDER — MEPERIDINE HCL 25 MG/ML IJ SOLN: 6.2500 mg | INTRAMUSCULAR | Status: DC | PRN

## 2016-04-06 MED ORDER — LACTATED RINGERS IV SOLN
INTRAVENOUS | Status: DC | PRN
  Administered 2016-04-06: 19:00:00 via INTRAVENOUS

## 2016-04-06 MED ORDER — ONDANSETRON HCL 4 MG/2ML IJ SOLN
4.0000 mg | Freq: Three times a day (TID) | INTRAMUSCULAR | Status: DC | PRN
  Administered 2016-04-07: 4 mg via INTRAVENOUS
  Filled 2016-04-06: qty 2

## 2016-04-06 MED ORDER — DIPHENHYDRAMINE HCL 50 MG/ML IJ SOLN
12.5000 mg | INTRAMUSCULAR | Status: DC | PRN
  Administered 2016-04-06: 12.5 mg via INTRAVENOUS
  Filled 2016-04-06 (×2): qty 1

## 2016-04-06 MED ORDER — KETOROLAC TROMETHAMINE 30 MG/ML IJ SOLN
INTRAMUSCULAR | Status: AC
  Administered 2016-04-06: 30 mg via INTRAVENOUS
  Filled 2016-04-06: qty 1

## 2016-04-06 MED ORDER — LACTATED RINGERS IV SOLN: INTRAVENOUS | Status: DC

## 2016-04-06 MED ORDER — KETOROLAC TROMETHAMINE 30 MG/ML IJ SOLN
30.0000 mg | Freq: Once | INTRAMUSCULAR | Status: AC
Start: 2016-04-06 — End: 2016-04-06
  Administered 2016-04-06: 30 mg via INTRAVENOUS

## 2016-04-06 MED ORDER — ONDANSETRON HCL 4 MG/2ML IJ SOLN
4.0000 mg | Freq: Four times a day (QID) | INTRAMUSCULAR | Status: DC | PRN
  Administered 2016-04-06: 4 mg via INTRAVENOUS

## 2016-04-06 MED ORDER — BUTORPHANOL TARTRATE 1 MG/ML IJ SOLN: 1.0000 mg | INTRAMUSCULAR | Status: DC | PRN

## 2016-04-06 MED ORDER — SOD CITRATE-CITRIC ACID 500-334 MG/5ML PO SOLN
30.0000 mL | ORAL | Status: DC | PRN
  Administered 2016-04-06: 30 mL via ORAL
  Filled 2016-04-06: qty 30

## 2016-04-06 MED ORDER — OXYTOCIN 40 UNITS IN LACTATED RINGERS INFUSION - SIMPLE MED
2.5000 [IU]/h | INTRAVENOUS | Status: DC
  Administered 2016-04-06: 1 mL via INTRAVENOUS
  Administered 2016-04-06: 399 mL via INTRAVENOUS
  Filled 2016-04-06: qty 1000

## 2016-04-06 MED ORDER — MIDAZOLAM HCL 2 MG/2ML IJ SOLN
INTRAMUSCULAR | Status: DC | PRN
  Administered 2016-04-06 (×2): 1 mg via INTRAVENOUS

## 2016-04-06 MED ORDER — PHENYLEPHRINE HCL 10 MG/ML IJ SOLN
INTRAMUSCULAR | Status: DC | PRN
Start: 2016-04-06 — End: 2016-04-06
  Administered 2016-04-06 (×3): 100 ug via INTRAVENOUS

## 2016-04-06 MED ORDER — SODIUM CHLORIDE 0.9% FLUSH: 3.0000 mL | INTRAVENOUS | Status: DC | PRN

## 2016-04-06 MED ORDER — NALBUPHINE HCL 10 MG/ML IJ SOLN
5.0000 mg | INTRAMUSCULAR | Status: DC | PRN
  Administered 2016-04-07 – 2016-04-08 (×6): 5 mg via INTRAVENOUS
  Filled 2016-04-06 (×7): qty 1

## 2016-04-06 MED ORDER — BUPIVACAINE 0.25 % ON-Q PUMP DUAL CATH 400 ML
INJECTION | Status: AC
  Filled 2016-04-06: qty 400

## 2016-04-06 MED ORDER — OXYTOCIN BOLUS FROM INFUSION: 500.0000 mL | INTRAVENOUS | Status: DC

## 2016-04-06 MED ORDER — SCOPOLAMINE 1 MG/3DAYS TD PT72: 1.0000 | MEDICATED_PATCH | Freq: Once | TRANSDERMAL | Status: DC

## 2016-04-06 SURGICAL SUPPLY — 25 items
BARRIER ADHS 3X4 INTERCEED (GAUZE/BANDAGES/DRESSINGS) ×4 IMPLANT
CANISTER SUCT 3000ML (MISCELLANEOUS) ×4 IMPLANT
CATH KIT ON-Q SILVERSOAK 5IN (CATHETERS) ×8 IMPLANT
CHLORAPREP W/TINT 26ML (MISCELLANEOUS) ×8 IMPLANT
DRSG TELFA 3X8 NADH (GAUZE/BANDAGES/DRESSINGS) IMPLANT
ELECT REM PT RETURN 9FT ADLT (ELECTROSURGICAL) ×4
ELECTRODE REM PT RTRN 9FT ADLT (ELECTROSURGICAL) ×2 IMPLANT
GAUZE SPONGE 4X4 12PLY STRL (GAUZE/BANDAGES/DRESSINGS) IMPLANT
GOWN STRL REUS W/ TWL LRG LVL3 (GOWN DISPOSABLE) ×8 IMPLANT
GOWN STRL REUS W/TWL LRG LVL3 (GOWN DISPOSABLE) ×8
NS IRRIG 1000ML POUR BTL (IV SOLUTION) ×4 IMPLANT
PAD OB MATERNITY 4.3X12.25 (PERSONAL CARE ITEMS) ×4 IMPLANT
PAD PREP 24X41 OB/GYN DISP (PERSONAL CARE ITEMS) IMPLANT
SUT MNCRL 4-0 (SUTURE)
SUT MNCRL 4-0 27XMFL (SUTURE)
SUT PDS AB 1 TP1 96 (SUTURE) ×4 IMPLANT
SUT PLAIN 2 0 XLH (SUTURE) ×4 IMPLANT
SUT PLAIN GUT 2-0 30 C14 SG823 (SUTURE) ×4
SUT VIC AB 0 CT1 36 (SUTURE) ×12 IMPLANT
SUT VIC AB 2-0 SH 27 (SUTURE) ×4
SUT VIC AB 2-0 SH 27XBRD (SUTURE) ×4 IMPLANT
SUT VIC AB 3-0 SH 27 (SUTURE) ×2
SUT VIC AB 3-0 SH 27X BRD (SUTURE) ×2 IMPLANT
SUTURE MNCRL 4-0 27XMF (SUTURE) IMPLANT
SUTURE PLN GUT2-0 30 C14 SG823 (SUTURE) ×2 IMPLANT

## 2016-04-06 NOTE — Anesthesia Procedure Notes (Signed)
Spinal Patient location during procedure: OR Start time: 04/06/2016 6:55 PM End time: 04/06/2016 7:04 PM Staffing Performed by: anesthesiologist  Preanesthetic Checklist Completed: patient identified, site marked, surgical consent, pre-op evaluation, timeout performed, IV checked, risks and benefits discussed and monitors and equipment checked Spinal Block Patient position: sitting Prep: Betadine Patient monitoring: heart rate, cardiac monitor, continuous pulse ox and blood pressure Approach: midline Location: L4-5 Injection technique: single-shot Needle Needle type: Whitacre  Needle gauge: 27 G Needle length: 9 cm Needle insertion depth: 8 cm Assessment Sensory level: T6

## 2016-04-06 NOTE — Progress Notes (Signed)
Late documentation due to patient care:  24yo G2P1001 at 40+4wks presenting in active labor with an EFW of 4392grams, AC>97th%, normal fluid by ultrasound a week ago. She has a hx of IOL at 41wks with a 8# baby and a painful vaginal tear, with 36hrs of induction and several hours of pushing, and she remembers the baby almost "getting stuck" which led to the tear. She is worried that this baby is bigger and is asking for elective C/S. She has no hx of gDM with this pregnancy.  I have discussed this option with the patient. She is aware that this is an elective C/S and is not medically indicated at this time and this EFW, which may be as great as 4700 g but is not likely <5000g now. Given her prior bad experience, her planned desire for sterilization with no future pregnancies, and her clear and stated preference, I have agreed to proceed with a non-urgent pLTCS after a detailed discussion of the risks and benefits to NSVD and pLTCS.  The risks of cesarean section discussed with the patient included but were not limited to: bleeding which may require transfusion or reoperation; infection which may require antibiotics; injury to bowel, bladder, ureters or other surrounding organs; injury to the fetus; need for additional procedures including hysterectomy in the event of a life-threatening hemorrhage; placental abnormalities wth subsequent pregnancies, incisional problems, thromboembolic phenomenon and other postoperative/anesthesia complications. The patient concurred with the proposed plan, giving informed written consent for the procedure.   Patient has been NPO since this morning she will remain NPO for procedure. Anesthesia and OR aware. Preoperative prophylactic antibiotics and SCDs ordered on call to the OR.  To OR when ready.

## 2016-04-06 NOTE — Anesthesia Preprocedure Evaluation (Signed)
Anesthesia Evaluation  Patient identified by MRN, date of birth, ID band Patient awake    Reviewed: Allergy & Precautions, NPO status , Patient's Chart, lab work & pertinent test results  History of Anesthesia Complications Negative for: history of anesthetic complications  Airway Mallampati: III       Dental   Pulmonary Current Smoker,           Cardiovascular negative cardio ROS  + Valvular Problems/Murmurs (murmur as a chld)      Neuro/Psych negative neurological ROS     GI/Hepatic Neg liver ROS, GERD  Medicated,  Endo/Other  negative endocrine ROS  Renal/GU negative Renal ROS     Musculoskeletal   Abdominal   Peds  Hematology negative hematology ROS (+)   Anesthesia Other Findings   Reproductive/Obstetrics                             Anesthesia Physical Anesthesia Plan  ASA: II and emergent  Anesthesia Plan: Spinal   Post-op Pain Management:    Induction:   Airway Management Planned:   Additional Equipment:   Intra-op Plan:   Post-operative Plan:   Informed Consent: I have reviewed the patients History and Physical, chart, labs and discussed the procedure including the risks, benefits and alternatives for the proposed anesthesia with the patient or authorized representative who has indicated his/her understanding and acceptance.     Plan Discussed with:   Anesthesia Plan Comments:         Anesthesia Quick Evaluation

## 2016-04-06 NOTE — Transfer of Care (Signed)
Immediate Anesthesia Transfer of Care Note  Patient: Pamela [name]  Procedure(s) Performed: Procedure(s): CESAREAN SECTION WITH BILATERAL TUBAL LIGATION (N/A)  Patient Location: PACU  Anesthesia Type:Spinal  Level of Consciousness: awake, alert , oriented and patient cooperative  Airway & Oxygen Therapy: Patient Spontanous Breathing  Post-op Assessment: Report given to RN and Post -op Vital signs reviewed and stable  Post vital signs: Reviewed and stable  Last Vitals:  Filed Vitals:   04/06/16 1741 04/06/16 1756  BP: 117/51 133/99  Pulse: 91 100  Temp:    Resp:      Last Pain:  Filed Vitals:   04/06/16 1809  PainSc: 5          Complications: No apparent anesthesia complications

## 2016-04-06 NOTE — Op Note (Signed)
Cesarean Section Procedure Note  Indications: elective   Pre-operative Diagnosis:  1. Intrauterine pregnancy at 1778w4d;  2. Desires permanent sterilization; MA31 papers signed in April, and those have been requested.  Post-operative Diagnosis: same, delivered.  Procedure: 1. Primary Low Transverse Cesarean Section through Pfannenstiel incision  2. Bilateral tubal sterilization using bilateral salpingectomy  Surgeon: Christeen DouglasBethany Keren Alverio, MD  Assistant(s):  Carlean JewsMeredith Sigmon  Anesthesia: Spinal anesthesia  Estimated Blood Loss:  750         Drains: On-Q pump         Total IV Fluids: 700ml  Urine Output: 400ml         Specimens: Right and left Fallopian tubes; cord blood for maternal Rh neg status         Complications:  None; patient tolerated the procedure well.         Disposition: PACU - hemodynamically stable.         Condition: stable  Findings:  A female infant "Jetta LoutWilliam Jackson" in cephalic, not engaged presentation. Amniotic fluid - Clear  Birth weight 4600 g, 10#2oz Apgars of 9 and 9.   Intact placenta with a three-vessel cord.  Grossly normal uterus, tubes and ovaries bilaterally. No intraabdominal adhesions were noted.  Procedure Details  The patient was taken to Operating Room, identified as the correct patient and the procedure verified as C-Section Delivery. A Time Out was held and the above information confirmed.  After induction of anesthesia, the patient was draped and prepped in the usual sterile manner. A Pfannenstiel incision was made and carried down through the subcutaneous tissue to the fascia. Fascial incision was made and extended transversely with the Mayo scissors. The fascia was separated from the underlying rectus tissue superiorly and inferiorly. The peritoneum was identified and entered bluntly. Peritoneal incision was extended longitudinally. The utero-vesical peritoneal reflection was incised transversely and a bladder flap was created digitally.    A low transverse hysterotomy was made. The fetus was delivered atraumatically. The umbilical cord was clamped x2 and cut and the infant was handed to the awaiting pediatricians. The placenta was removed intact and appeared normal with a 3-vessel cord.   The uterus was exteriorized and cleared of all clot and debris. The hysterotomy was closed with running sutures of  0-Vicryl. A second imbricating layer was placed with the same suture. Excellent hemostasis was observed.   Attention was then turned to the tubal ligation. The left fallopian tube distinguished from the round ligament by identifying the fimbria and was grasped across its full length with heavy Kelly clamps. The tubal segment was excised with Metzenbaum scissors. Tubal ostea noted. The mesosalpinx was tied with 0 chronic suture.The procedure was repeated on the right side. 2-0 vicryl stitches were placed along the mesosalpinx on this side to assure hemostasis. *Care was noted to examine both tubal sites in situ to ensure the sutures were intact and no bleeding was noted. The uterus was returned to the abdomen. The On-q pump was placed without complication.  The pelvis was irrigated and again, excellent hemostasis was noted. The fascia was then reapproximated with running sutures of 0 Vicryl. The skin was reapproximated with a 4-0 Monocryl subcuticular stitch.  Instrument, sponge, and needle counts were correct prior to the abdominal closure and at the conclusion of the case.   The patient tolerated the procedure well and was transferred to the recovery room in stable condition.   Christeen DouglasBEASLEY, Leisl Spurrier, MD6/26/2017

## 2016-04-06 NOTE — H&P (Signed)
OB ADMISSION/ HISTORY & PHYSICAL:  Admission Date: 04/06/2016 12:36 PM  Admit Diagnosis: Induction of labor for postdates and suspected macrosomia   Virgie K Carole BinningMeadows is a 25 y.o. female presenting for induction of labor for postdates and suspected Macrosomia with history of difficult delivery of a 8#7oz baby with perineal laceration.    I was consulted today by Lanora ManisElizabeth, CNM at [facility] to discuss plan of care.  She had a growth US on 04/02/16 with an EFW of 4392 grams (9#11oz) with AC >97th%.  She also has new onset proteinuria and persistent HA for weeks with +1 edema.  No hx. Of gestational diabetes.   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 Of polyhydramnios,hx. Of marijuana use in pregnancy,  Rh Negative s/p Rhogam, alcohol abuse last ETOH 07/2015, anemia, proteinuria in 3rd trimester, increased risk of Down Syndrome 1:248- declined amnio/CVS, free cell DNA, and fetal echo, FOB hx. Of niece with genetic heart condition requiring surgery, and anxiety   Prenatal Labs: ABO, Rh:  A Negative Antibody:  Negative Rubella:   Immune Varicella: Immune RPR:   NR HBsAg:   Negative HIV:   Negative GTT: 91 GBS:   Negative   Medical / Surgical History :  Past medical history:  Past Medical History  Diagnosis Date  . Medical history non-contributory   . Heart murmur 1993    Resolved at age 89     Past surgical history:  Past Surgical History  Procedure Laterality Date  . Wisdom tooth extraction  August 2015    Family History: No family history on file.   Social History:  reports that she has been smoking.  She has never used smokeless tobacco. She reports that she does not drink alcohol or use illicit drugs.   Allergies: Sulfa antibiotics and Peroxyl    Current Medications at time of admission:  Prior to Admission  medications   Medication Sig Start Date End Date Taking? Authorizing Provider  ferrous sulfate 325 (65 FE) MG tablet Take 325 mg by mouth daily with breakfast.   Yes Historical Provider, MD  Prenatal Vit-Fe Fumarate-FA (PRENATAL MULTIVITAMIN) TABS tablet Take 1 tablet by mouth daily at 12 noon.   Yes Historical Provider, MD  ranitidine (ZANTAC) 150 MG tablet Take 150 mg by mouth daily as needed for heartburn.   Yes Historical Provider, MD     Review of Systems: Active FM Ctx every 3-5 minutes - states she doesn't feel them  No LOF  / SROM  No bloody show    Physical Exam:  VS: Blood pressure 129/93, pulse 114, temperature 98.6 F (37 C), temperature source Oral, resp. rate 18, height 5\' 4"  (1.626 m), weight 88.905 kg (196 lb), last menstrual period 06/27/2015.  General: alert and oriented, appears calm Heart: RRR Lungs: Clear lung fields Abdomen: Gravid, soft and non-tender, non-distended / uterus: gravid, non-tender Leopold's: EFW: 9#10oz, Fundal height: 43cm Extremities: +1 BLE edema  Genitalia / VE: Dilation: 4 Effacement (%): 70 Station: -2 Exam by:: Sharyl NimrodMeredith, CNM  FHR: baseline rate 135 bpm / variability Moderate / accelerations + / no  decelerations TOCO: 3-5 minutes/ mild   Assessment: 40+[redacted] weeks gestation Induction stage of labor Suspected macrosomia  FHR category 1   Plan:  1. Admit for Induction of Labor   - Pt. Declines augmentation until she talks with Dr. Dalbert GarnetBeasley about  elective primary cesarean section for suspected macrosomia  2. GBS Negative  3. Hx. Of Marijuana use in pregancy  - UDS Negative on admission  4. Contraception  - BTL papers signed 4/5/175.  5. Elevated BP and proteinuria  - CBC, CMP, uric acid, protein/creatinine ratio 6. RH negative   - S/p Rhogam  Dr. Dalbert GarnetBeasley notified of admission / plan of care - she will be by to discuss risks/benefits/alternatives of cesarean delivery with patient   Carlean JewsMeredith Linlee Cromie, CNM

## 2016-04-07 ENCOUNTER — Encounter: Payer: Self-pay | Admitting: Obstetrics and Gynecology

## 2016-04-07 LAB — FETAL SCREEN: FETAL SCREEN: NEGATIVE

## 2016-04-07 LAB — CBC
HEMATOCRIT: 28 % — AB (ref 35.0–47.0)
Hemoglobin: 9.9 g/dL — ABNORMAL LOW (ref 12.0–16.0)
MCH: 31.5 pg (ref 26.0–34.0)
MCHC: 35.3 g/dL (ref 32.0–36.0)
MCV: 89.2 fL (ref 80.0–100.0)
PLATELETS: 136 10*3/uL — AB (ref 150–440)
RBC: 3.14 MIL/uL — ABNORMAL LOW (ref 3.80–5.20)
RDW: 12.8 % (ref 11.5–14.5)
WBC: 8.9 10*3/uL (ref 3.6–11.0)

## 2016-04-07 LAB — RPR: RPR Ser Ql: NONREACTIVE

## 2016-04-07 MED ORDER — FAMOTIDINE 20 MG PO TABS
10.0000 mg | ORAL_TABLET | Freq: Every day | ORAL | Status: DC
  Administered 2016-04-07 – 2016-04-09 (×3): 10 mg via ORAL
  Filled 2016-04-07 (×3): qty 1

## 2016-04-07 MED ORDER — MENTHOL 3 MG MT LOZG: 1.0000 | LOZENGE | OROMUCOSAL | Status: DC | PRN

## 2016-04-07 MED ORDER — WITCH HAZEL-GLYCERIN EX PADS: 1.0000 "application " | MEDICATED_PAD | CUTANEOUS | Status: DC | PRN

## 2016-04-07 MED ORDER — SIMETHICONE 80 MG PO CHEW
80.0000 mg | CHEWABLE_TABLET | Freq: Three times a day (TID) | ORAL | Status: DC
  Administered 2016-04-07 – 2016-04-09 (×7): 80 mg via ORAL
  Filled 2016-04-07 (×7): qty 1

## 2016-04-07 MED ORDER — FLEET ENEMA 7-19 GM/118ML RE ENEM: 1.0000 | ENEMA | Freq: Every day | RECTAL | Status: DC | PRN

## 2016-04-07 MED ORDER — DIBUCAINE 1 % RE OINT: 1.0000 "application " | TOPICAL_OINTMENT | RECTAL | Status: DC | PRN

## 2016-04-07 MED ORDER — SENNOSIDES-DOCUSATE SODIUM 8.6-50 MG PO TABS
2.0000 | ORAL_TABLET | ORAL | Status: DC
  Administered 2016-04-08 – 2016-04-09 (×2): 2 via ORAL
  Filled 2016-04-07 (×2): qty 2

## 2016-04-07 MED ORDER — RHO D IMMUNE GLOBULIN 1500 UNIT/2ML IJ SOSY
300.0000 ug | PREFILLED_SYRINGE | Freq: Once | INTRAMUSCULAR | Status: AC
  Administered 2016-04-07: 300 ug via INTRAVENOUS
  Filled 2016-04-07: qty 2

## 2016-04-07 MED ORDER — OXYTOCIN 40 UNITS IN LACTATED RINGERS INFUSION - SIMPLE MED: 2.5000 [IU]/h | INTRAVENOUS | Status: AC

## 2016-04-07 MED ORDER — SIMETHICONE 80 MG PO CHEW: 80.0000 mg | CHEWABLE_TABLET | ORAL | Status: DC | PRN

## 2016-04-07 MED ORDER — TETANUS-DIPHTH-ACELL PERTUSSIS 5-2.5-18.5 LF-MCG/0.5 IM SUSP: 0.5000 mL | Freq: Once | INTRAMUSCULAR | Status: DC

## 2016-04-07 MED ORDER — DIPHENHYDRAMINE HCL 25 MG PO CAPS: 25.0000 mg | ORAL_CAPSULE | Freq: Four times a day (QID) | ORAL | Status: DC | PRN

## 2016-04-07 MED ORDER — OXYCODONE-ACETAMINOPHEN 5-325 MG PO TABS
1.0000 | ORAL_TABLET | ORAL | Status: DC | PRN
  Administered 2016-04-07 – 2016-04-08 (×4): 1 via ORAL
  Administered 2016-04-08 (×2): 2 via ORAL
  Administered 2016-04-08 – 2016-04-09 (×4): 1 via ORAL
  Filled 2016-04-07 (×4): qty 1
  Filled 2016-04-07 (×2): qty 2
  Filled 2016-04-07: qty 1
  Filled 2016-04-07 (×2): qty 2
  Filled 2016-04-07: qty 1

## 2016-04-07 MED ORDER — MEASLES, MUMPS & RUBELLA VAC ~~LOC~~ INJ: 0.5000 mL | INJECTION | Freq: Once | SUBCUTANEOUS | Status: DC

## 2016-04-07 MED ORDER — COCONUT OIL OIL: 1.0000 "application " | TOPICAL_OIL | Status: DC | PRN

## 2016-04-07 MED ORDER — PRENATAL MULTIVITAMIN CH
1.0000 | ORAL_TABLET | Freq: Every day | ORAL | Status: DC
  Administered 2016-04-07 – 2016-04-09 (×3): 1 via ORAL
  Filled 2016-04-07 (×3): qty 1

## 2016-04-07 MED ORDER — ACETAMINOPHEN 325 MG PO TABS: 650.0000 mg | ORAL_TABLET | ORAL | Status: DC | PRN

## 2016-04-07 MED ORDER — SIMETHICONE 80 MG PO CHEW
80.0000 mg | CHEWABLE_TABLET | ORAL | Status: DC
  Administered 2016-04-08 – 2016-04-09 (×2): 80 mg via ORAL
  Filled 2016-04-07 (×2): qty 1

## 2016-04-07 MED ORDER — IBUPROFEN 600 MG PO TABS
600.0000 mg | ORAL_TABLET | Freq: Four times a day (QID) | ORAL | Status: DC
  Administered 2016-04-07 – 2016-04-09 (×10): 600 mg via ORAL
  Filled 2016-04-07 (×6): qty 1

## 2016-04-07 MED ORDER — LACTATED RINGERS IV SOLN
INTRAVENOUS | Status: DC
  Administered 2016-04-07: 05:00:00 via INTRAVENOUS

## 2016-04-07 MED ORDER — FERROUS SULFATE 325 (65 FE) MG PO TABS
325.0000 mg | ORAL_TABLET | Freq: Every day | ORAL | Status: DC
  Administered 2016-04-07 – 2016-04-09 (×3): 325 mg via ORAL
  Filled 2016-04-07 (×3): qty 1

## 2016-04-07 MED ORDER — BISACODYL 10 MG RE SUPP: 10.0000 mg | Freq: Every day | RECTAL | Status: DC | PRN

## 2016-04-07 MED FILL — Bupivacaine HCl Inj 0.25%: Qty: 400 | Status: AC

## 2016-04-07 NOTE — Progress Notes (Signed)
Subjective: Postpartum Day 1: Cesarean Delivery Patient reports  No c/o  Objective: Vital signs in last 24 hours: Temp:  [97.7 F (36.5 C)-98.6 F (37 C)] 98.2 F (36.8 C) (06/27 0731) Pulse Rate:  [77-122] 82 (06/27 0731) Resp:  [16-21] 18 (06/27 0731) BP: (98-158)/(49-132) 119/62 mmHg (06/27 0731) SpO2:  [97 %-99 %] 97 % (06/27 0731) Weight:  [196 lb (88.905 kg)] 196 lb (88.905 kg) (06/26 1311)  Physical Exam:  General: alert and cooperative Lochia: appropriate Uterine Fundus: firm Incision: no significant drainage DVT Evaluation: No evidence of DVT seen on physical exam Lungs cta  cv rrr.   Recent Labs  04/06/16 1350 04/07/16 0446  HGB 11.7* 9.9*  HCT 32.8* 28.0*    Assessment/Plan: Status post Cesarean section. Doing well postoperatively.  Continue current care D/c iv + foley .  SCHERMERHORN,THOMAS 04/07/2016, 8:22 AM

## 2016-04-07 NOTE — Progress Notes (Signed)
Patient ID: Pamela BickersAmber K [name], female   DOB: March 23, 1991, 25 y.o.   MRN: [id number] Pt to receive rhogam today

## 2016-04-07 NOTE — Anesthesia Postprocedure Evaluation (Signed)
Anesthesia Post Note  Patient: Hospital doctorAmber K Pecore  Procedure(s) Performed: Procedure(s) (LRB): CESAREAN SECTION WITH BILATERAL TUBAL LIGATION (N/A)  Patient location during evaluation: Mother Baby Anesthesia Type: Spinal Level of consciousness: awake and alert and oriented Pain management: pain level controlled Vital Signs Assessment: post-procedure vital signs reviewed and stable Respiratory status: spontaneous breathing Cardiovascular status: stable Postop Assessment: no headache Anesthetic complications: no    Last Vitals:  Filed Vitals:   04/07/16 0204 04/07/16 0300  BP: 114/71 98/66  Pulse: 81 79  Temp: 36.8 C 36.9 C  Resp: 18 18    Last Pain:  Filed Vitals:   04/07/16 0333  PainSc: 0-No pain                 Cliff Damiani,  Alessandra BevelsJennifer M

## 2016-04-07 NOTE — Anesthesia Post-op Follow-up Note (Signed)
  Anesthesia Pain Follow-up Note  Patient: Glori Bickersmber K Reiling  Day #: 1  Date of Follow-up: 04/07/2016 Time: 7:26 AM  Last Vitals:  Filed Vitals:   04/07/16 0204 04/07/16 0300  BP: 114/71 98/66  Pulse: 81 79  Temp: 36.8 C 36.9 C  Resp: 18 18    Level of Consciousness: alert  Pain: none   Side Effects:None  Catheter Site Exam:clean, dry     Plan: D/C from anesthesia care  Rica MastBachich,  Chaysen Tillman M

## 2016-04-08 ENCOUNTER — Encounter: Payer: Self-pay | Admitting: *Deleted

## 2016-04-08 LAB — SURGICAL PATHOLOGY

## 2016-04-08 LAB — RHOGAM INJECTION: Unit division: 0

## 2016-04-08 NOTE — Progress Notes (Signed)
Subjective: Postpartum Day 2: Cesarean Delivery Patient reports incisional pain, tolerating PO and no problems voiding.    Objective: Vital signs in last 24 hours: Temp:  [98 F (36.7 C)-98.5 F (36.9 C)] 98.5 F (36.9 C) (06/28 0729) Pulse Rate:  [77-86] 86 (06/28 0729) Resp:  [18-24] 18 (06/28 0729) BP: (111-126)/(61-76) 116/76 mmHg (06/28 0729) SpO2:  [96 %-99 %] 96 % (06/28 0729)  Physical Exam:  General: alert, cooperative and appears stated age Lochia: appropriate Uterine Fundus: firm Incision: no significant drainage, no dehiscence, no significant erythema DVT Evaluation: No evidence of DVT seen on physical exam.   Recent Labs  04/06/16 1350 04/07/16 0446  HGB 11.7* 9.9*  HCT 32.8* 28.0*    Assessment/Plan: Status post Cesarean section. Doing well postoperatively.  Removed dressing today Lactation consult Continue current care.  Christeen DouglasBEASLEY, Pamela [name] 04/08/2016, 8:41 AM

## 2016-04-09 MED ORDER — OXYCODONE-ACETAMINOPHEN 5-325 MG PO TABS: 1.0000 | ORAL_TABLET | ORAL | Status: DC | PRN

## 2016-04-09 MED ORDER — IBUPROFEN 600 MG PO TABS: 600.0000 mg | ORAL_TABLET | Freq: Four times a day (QID) | ORAL | Status: DC | PRN

## 2016-04-09 MED ORDER — FERROUS SULFATE 325 (65 FE) MG PO TABS: 325.0000 mg | ORAL_TABLET | Freq: Every day | ORAL | Status: DC

## 2016-04-09 NOTE — Progress Notes (Signed)
Discharge instructions complete and prescriptions given. Patient verbalizes understanding of teaching. Patient discharged home at 1335.

## 2016-04-09 NOTE — Discharge Instructions (Signed)
Cesarean Delivery, Care After °Refer to this sheet in the next few weeks. These instructions provide you with information on caring for yourself after your procedure. Your health care provider may also give you specific instructions. Your treatment has been planned according to current medical practices, but problems sometimes occur. Call your health care provider if you have any problems or questions after you go home. °HOME CARE INSTRUCTIONS  °· Only take over-the-counter or prescription medications as directed by your health care provider. °· Do not drink alcohol, especially if you are breastfeeding or taking medication to relieve pain. °· Do not chew or smoke tobacco. °· Continue to use good perineal care. Good perineal care includes: °¨ Wiping your perineum from front to back. °¨ Keeping your perineum clean. °· Check your surgical cut (incision) daily for increased redness, drainage, swelling, or separation of skin. °· Clean your incision gently with soap and water every day, and then pat it dry. If your health care provider says it is okay, leave the incision uncovered. Use a bandage (dressing) if the incision is draining fluid or appears irritated. If the adhesive strips across the incision do not fall off within 7 days, carefully peel them off. °· Hug a pillow when coughing or sneezing until your incision is healed. This helps to relieve pain. °· Do not use tampons or douche until your health care provider says it is okay. °· Shower, wash your hair, and take tub baths as directed by your health care provider. °· Wear a well-fitting bra that provides breast support. °· Limit wearing support panties or control-top hose. °· Drink enough fluids to keep your urine clear or pale yellow. °· Eat high-fiber foods such as whole grain cereals and breads, brown rice, beans, and fresh fruits and vegetables every day. These foods may help prevent or relieve constipation. °· Resume activities such as climbing stairs,  driving, lifting, exercising, or traveling as directed by your health care provider. °· Talk to your health care provider about resuming sexual activities. This is dependent upon your risk of infection, your rate of healing, and your comfort and desire to resume sexual activity. °· Try to have someone help you with your household activities and your newborn for at least a few days after you leave the hospital. °· Rest as much as possible. Try to rest or take a nap when your newborn is sleeping. °· Increase your activities gradually. °· Keep all of your scheduled postpartum appointments. It is very important to keep your scheduled follow-up appointments. At these appointments, your health care provider will be checking to make sure that you are healing physically and emotionally. °SEEK MEDICAL CARE IF:  °· You are passing large clots from your vagina. Save any clots to show your health care provider. °· You have a foul smelling discharge from your vagina. °· You have trouble urinating. °· You are urinating frequently. °· You have pain when you urinate. °· You have a change in your bowel movements. °· You have increasing redness, pain, or swelling near your incision. °· You have pus draining from your incision. °· Your incision is separating. °· You have painful, hard, or reddened breasts. °· You have a severe headache. °· You have blurred vision or see spots. °· You feel sad or depressed. °· You have thoughts of hurting yourself or your newborn. °· You have questions about your care, the care of your newborn, or medications. °· You are dizzy or light-headed. °· You have a rash. °· You   have pain, redness, or swelling at the site of the removed intravenous access (IV) tube.  You have nausea or vomiting.  You stopped breastfeeding and have not had a menstrual period within 12 weeks of stopping.  You are not breastfeeding and have not had a menstrual period within 12 weeks of delivery.  You have a fever. SEEK  IMMEDIATE MEDICAL CARE IF:  You have persistent pain.  You have chest pain.  You have shortness of breath.  You faint.  You have leg pain.  You have stomach pain.  Your vaginal bleeding saturates 2 or more sanitary pads in 1 hour. MAKE SURE YOU:   Understand these instructions.  Will watch your condition.  Will get help right away if you are not doing well or get worse.   This information is not intended to replace advice given to you by your health care provider. Make sure you discuss any questions you have with your health care provider.   Document Released: 06/20/2002 Document Revised: 10/19/2014 Document Reviewed: 05/25/2012 Elsevier Interactive Patient Education 2016 [employer] Care After Delivery Congratulations on your new baby!!  Refer to this sheet in the next few weeks. These discharge instructions provide you with information on caring for yourself after delivery. Your caregiver may also give you specific instructions. Your treatment has been planned according to the most current medical practices available, but problems sometimes occur. Call your caregiver if you have any problems or questions after you go home.  HOME CARE INSTRUCTIONS  Take over-the-counter or prescription medicines only as directed by your caregiver or pharmacist.  Do not drink alcohol, especially if you are breastfeeding or taking medicine to relieve pain.  Do not chew or smoke tobacco.  Do not use illegal drugs.  Continue to use good perineal care. Good perineal care includes:  Wiping your perineum from front to back.  Keeping your perineum clean.  Do not use tampons or douche until your caregiver says it is okay.  Shower, wash your hair, and take tub baths as directed by your caregiver.  Wear a well-fitting bra that provides breast support.  Eat healthy foods.  Drink enough fluids to keep your urine clear or pale yellow.  Eat high-fiber foods such as whole grain cereals  and breads, brown rice, beans, and fresh fruits and vegetables every day. These foods may help prevent or relieve constipation.  Follow your caregiver's recommendations regarding resumption of activities such as climbing stairs, driving, lifting, exercising, or traveling. Specifically, no driving for two weeks, so that you are comfortable reacting quickly in an emergency.  Talk to your caregiver about resuming sexual activities. Resumption of sexual activities is dependent upon your risk of infection, your rate of healing, and your comfort and desire to resume sexual activity. Usually we recommend waiting about six weeks, or until your bleeding stops and you are interested in sex.  Try to have someone help you with your household activities and your newborn for at least a few days after you leave the hospital. Even longer is better.  Rest as much as possible. Try to rest or take a nap when your newborn is sleeping. Sleep deprivation can be very hard after delivery.  Increase your activities gradually.  Keep all of your scheduled postpartum appointments. It is very important to keep your scheduled follow-up appointments. At these appointments, your caregiver will be checking to make sure that you are healing physically and emotionally.  SEEK MEDICAL CARE IF:   You are passing large  clots from your vagina.   You have a foul smelling discharge from your vagina.  You have trouble urinating.  You are urinating frequently.  You have pain when you urinate.  You have a change in your bowel movements.  You have increasing redness, pain, or swelling near your vaginal incision (episiotomy) or vaginal tear.  You have pus draining from your cesarean incision or you feel your incision is separating   You have painful, hard, or reddened breasts.  You have a severe headache.  You have blurred vision or see spots.  You feel sad or depressed.  You have thoughts of hurting yourself or your  newborn.  You have questions about your care, the care of your newborn, or medicines.  You are dizzy or light-headed.  You have a rash.  You have nausea or vomiting.  You were breastfeeding and have not had a menstrual period within 12 weeks after you stopped breastfeeding.  You are not breastfeeding and have not had a menstrual period by the 12th week after delivery.  You have a fever.  SEEK IMMEDIATE MEDICAL CARE IF:   You have persistent pain.  You have chest pain.  You have shortness of breath.  You faint.  You have leg pain.  You have stomach pain.  Your vaginal bleeding saturates two or more sanitary pads in 1 hour.  MAKE SURE YOU:   Understand these instructions.  Will get help right away if you are not doing well or get worse.   Document Released: 09/25/2000 Document Revised: 02/12/2014 Document Reviewed: 05/25/2012  [facility] Patient Information 2015 [employer] [place]. This information is not intended to replace advice given to you by your health care provider. Make sure you discuss any questions you have with your health care provider.

## 2016-04-09 NOTE — Discharge Summary (Signed)
Obstetric Discharge Summary   Patient ID: Pamela [name] MRN: [id number] DOB/AGE: 01-04-1991 25 y.o.   Date of Admission: 04/06/2016  Date of Discharge: 04/09/16  Admitting Diagnosis: Induction of labor at 2638w4d for suspected large for gestational age 194500grams and AC >97th% and elective cesarean section  Secondary Diagnosis: RH negative status, new onset proteinuria   Mode of Delivery: primary cesarean section: low uterine, transverse     Discharge Diagnosis: Reasons for cesarean section: Elective for large for gestational age 25 grams, Bilateral Tubal Ligation, ABL Anemia, RH Negative s/p Rhogam    Intrapartum Procedures: None   Post partum procedures: Bilateral Tubal Ligation, s/p Rhogam   Complications: none   Brief Hospital Course  (Cesarean Section): Pamela [name] is a [id number] who underwent cesarean section on 04/06/2016.  Patient had an uncomplicated surgery; for further details of this surgery, please refer to the operative note.  Patient had an uncomplicated postpartum course.  By time of discharge on POD#3, her pain was controlled on oral pain medications; she had appropriate lochia and was ambulating, voiding without difficulty, tolerating regular diet and passing flatus.   She was deemed stable for discharge to home.    Labs: CBC Latest Ref Rng 04/07/2016 04/06/2016 12/27/2013  WBC 3.6 - 11.0 K/uL 8.9 9.1 11.2(H)  Hemoglobin 12.0 - 16.0 g/dL 5.6(O9.9(L) 11.7(L) 12.6  Hematocrit 35.0 - 47.0 % 28.0(L) 32.8(L) 40.1  Platelets 150 - 440 K/uL 136(L) 171 237   A NEG  Physical exam:  Blood pressure 129/75, pulse 73, temperature 97.9 F (36.6 [name]), temperature source Oral, resp. rate 18, height 5\' 4"  (1.626 m), weight 88.905 kg (196 lb), last menstrual period 06/27/2015, SpO2 99 %, unknown if currently breastfeeding. General: alert and no distress Lochia: appropriate Abdomen: soft, NT Uterine Fundus: firm Incision: healing well, no significant drainage, no dehiscence, no  significant erythema Extremities: No evidence of DVT seen on physical exam. No lower extremity edema.  Discharge Instructions: Per After Visit Summary.  Discharge Instructions    Call MD for:  difficulty breathing, headache or visual disturbances    Complete by:  As directed      Call MD for:  extreme fatigue    Complete by:  As directed      Call MD for:  hives    Complete by:  As directed      Call MD for:  persistant dizziness or light-headedness    Complete by:  As directed      Call MD for:  persistant nausea and vomiting    Complete by:  As directed      Call MD for:  redness, tenderness, or signs of infection (pain, swelling, redness, odor or green/yellow discharge around incision site)    Complete by:  As directed      Call MD for:  severe uncontrolled pain    Complete by:  As directed      Call MD for:  temperature >100.4    Complete by:  As directed      Call MD for:    Complete by:  As directed   Signs and symptoms of postpartum depression, unable to care for yourself or your baby.  Go to ED if you develop suicidal or homicidal thoughts.     Diet - low sodium heart healthy    Complete by:  As directed      Discharge wound care:    Complete by:  As directed   Keep incision clean and pat dry.  Do not remove purple skin glue.     Driving restriction     Complete by:  As directed   Avoid driving for at least 2 weeks and while on narcotic pain medication     Lifting restrictions    Complete by:  As directed   Weight restriction of 15 lbs.     No dressing needed    Complete by:  As directed           Activity: Advance as tolerated. Pelvic rest for 6 weeks.  Also refer to After Visit Summary Diet: Regular Medications:   Medication List    TAKE these medications        ferrous sulfate 325 (65 FE) MG tablet  Take 1 tablet (325 mg total) by mouth daily with breakfast.     ibuprofen 600 MG tablet  Commonly known as:  ADVIL,MOTRIN  Take 1 tablet (600 mg total) by  mouth every 6 (six) hours as needed for mild pain.     oxyCODONE-acetaminophen 5-325 MG tablet  Commonly known as:  PERCOCET/ROXICET  Take 1-2 tablets by mouth every 4 (four) hours as needed for moderate pain or severe pain.     prenatal multivitamin Tabs tablet  Take 1 tablet by mouth daily at 12 noon.     ranitidine 150 MG tablet  Commonly known as:  ZANTAC  Take 150 mg by mouth daily as needed for heartburn.       Outpatient follow up:      Follow-up Information    Follow up with Christeen DouglasBEASLEY, BETHANY, MD. Schedule an appointment as soon as possible for a visit in 2 weeks.   Specialty:  Obstetrics and Gynecology   Why:  Needs a Monday appointment please    Contact information:   [address] [place] [id number] [phone]      Postpartum contraception: bilateral tubal ligation  Discharged Condition: good  Discharged to: home   Newborn Data:  Baby Boy named "Jetta LoutWilliam Jackson"  Disposition:home with mother  Apgars: APGAR (1 MIN): 9   APGAR (5 MINS): 9   APGAR (10 MINS):    Baby Feeding: Bottle and Breast  Karena AddisonSigmon, Pamela [name], CNM 04/09/2016

## 2017-12-29 IMAGING — US US MFM OB DETAIL+14 WK
1 series · 14 of 28 positions shown · non-contrast
Comparison: none

[Series 1: us mfm ob detail+14 wk · 0.22mm/px · 14 of 86 slices shown]
[im 4/86]
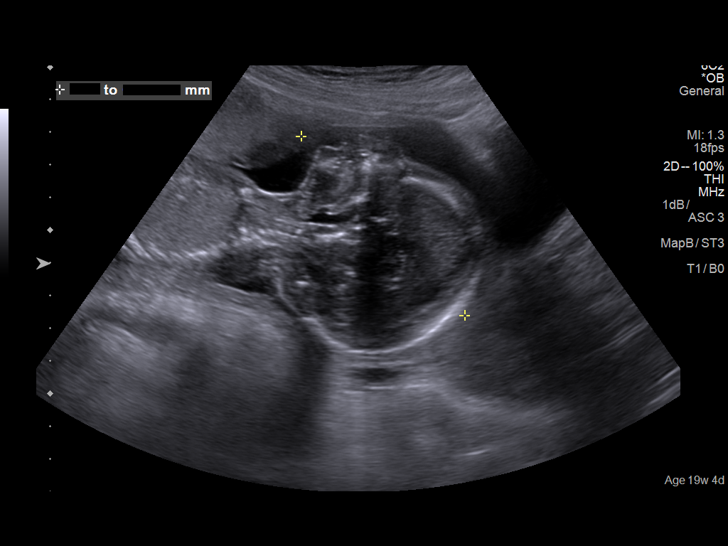
[im 10/86]
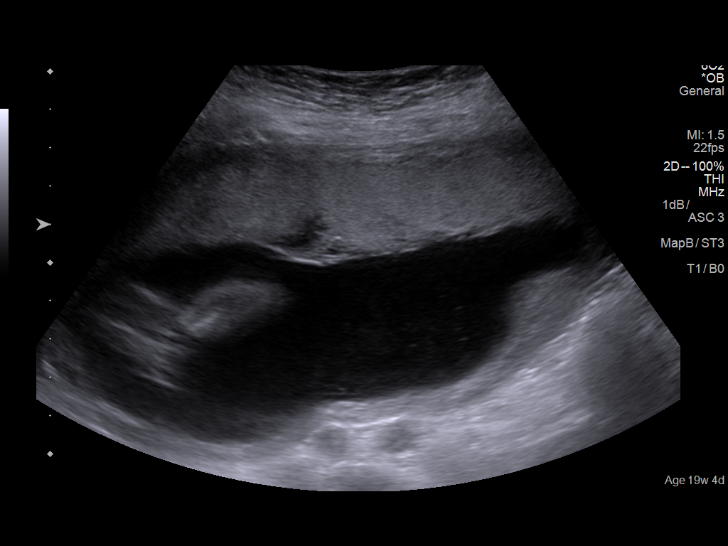
[im 16/86]
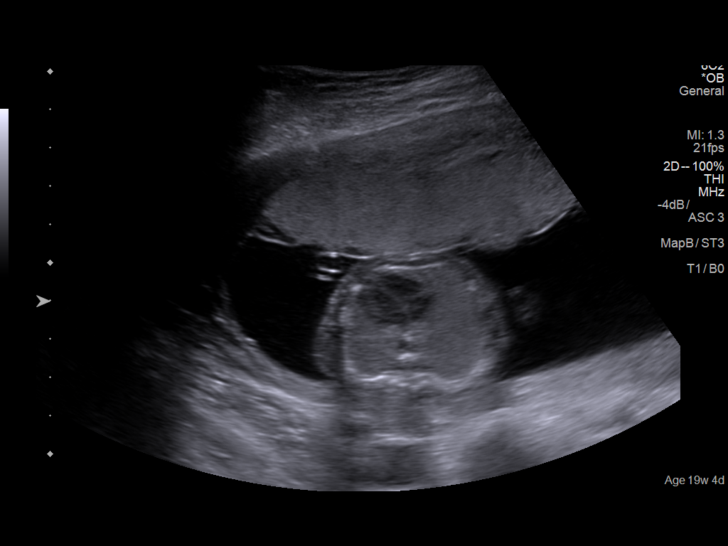
[im 23/86]
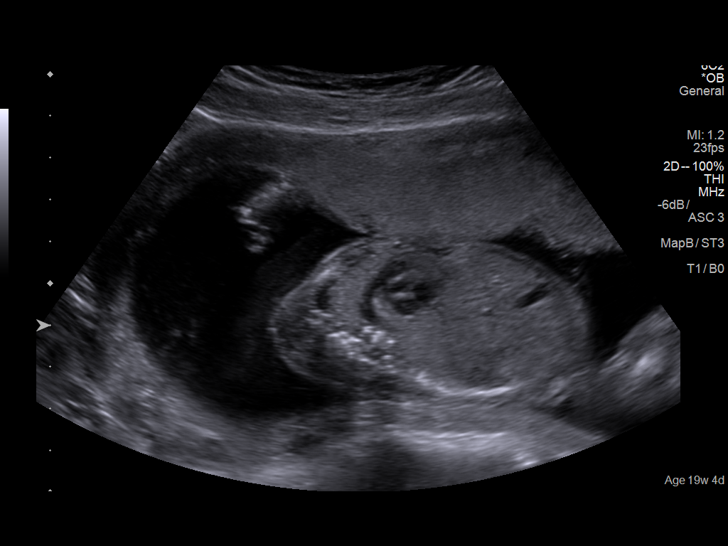
[im 29/86]
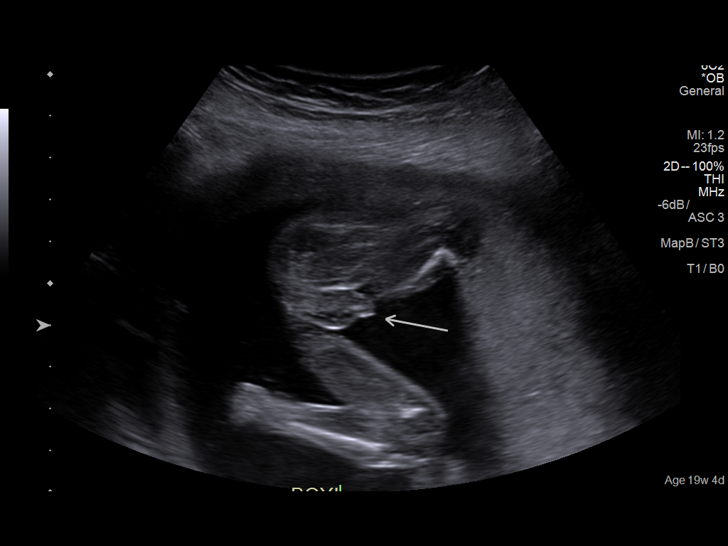
[im 35/86]
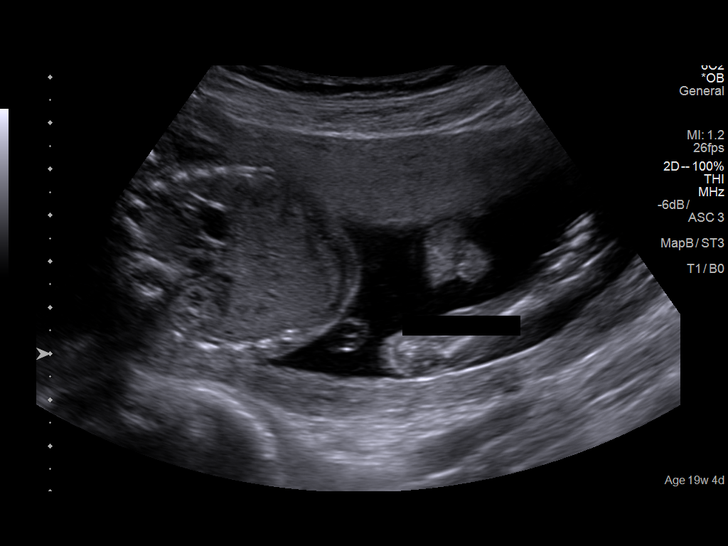
[im 41/86]
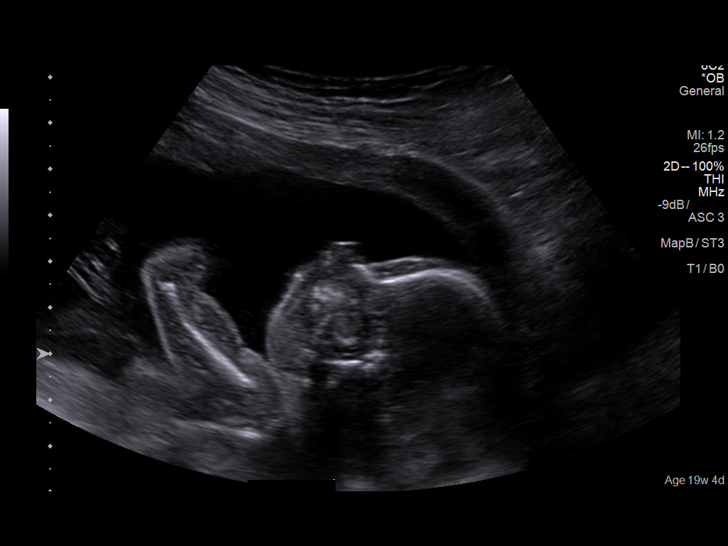
[im 48/86]
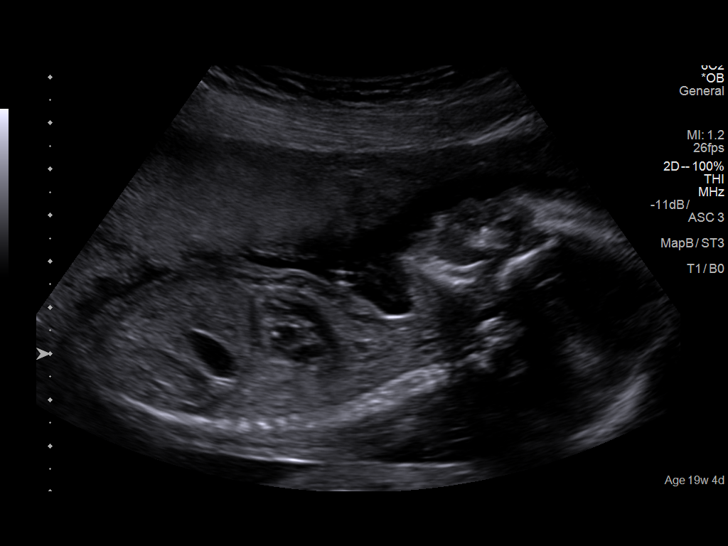
[im 54/86]
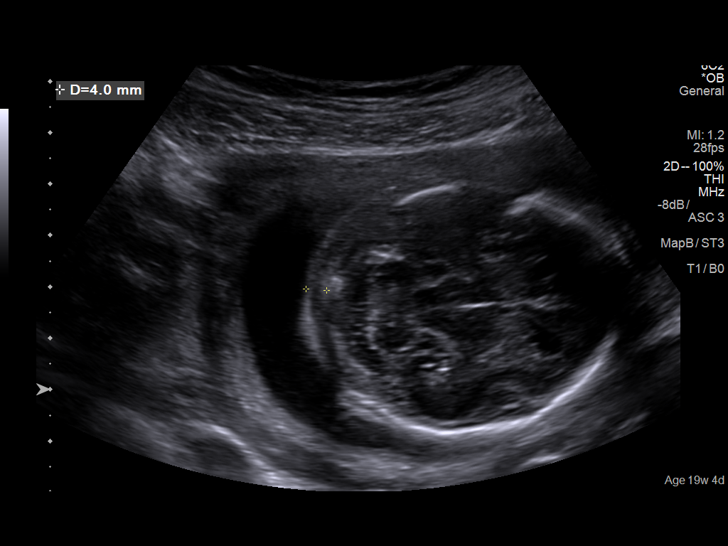
[im 60/86]
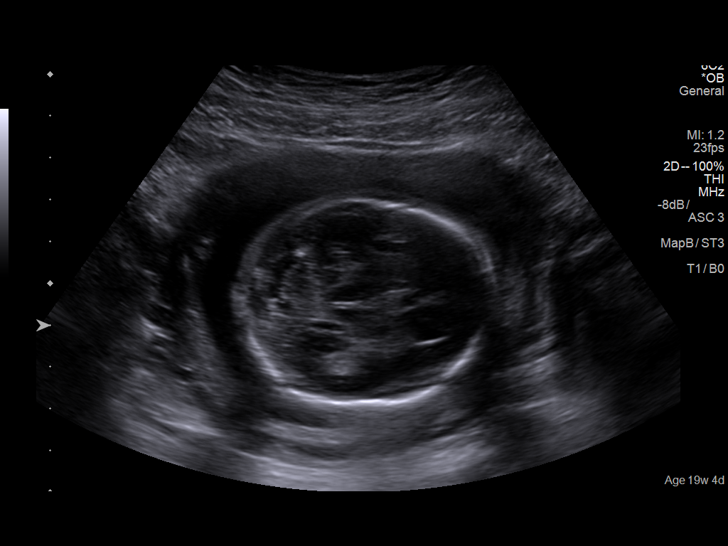
[im 67/86]
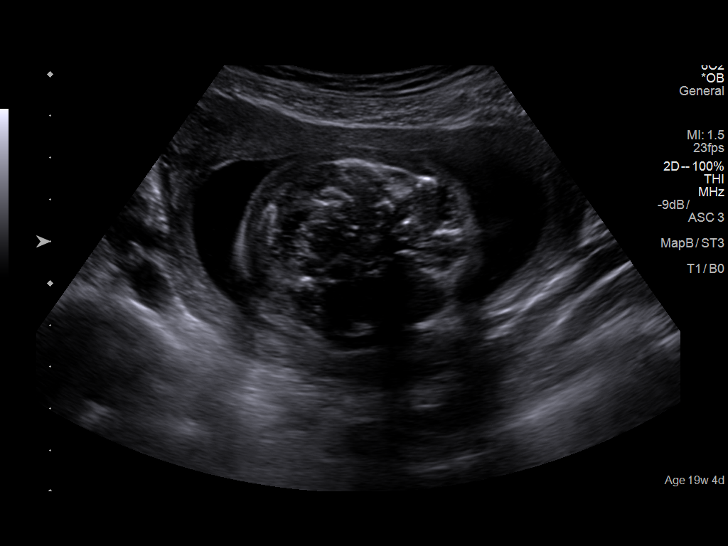
[im 73/86]
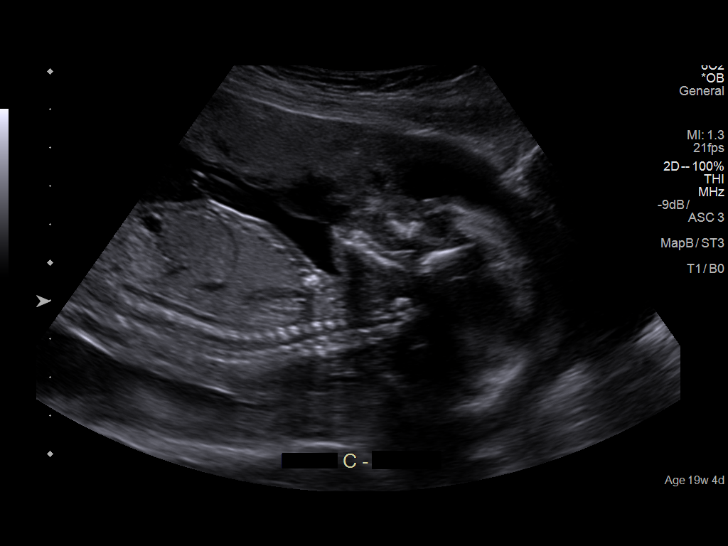
[im 79/86]
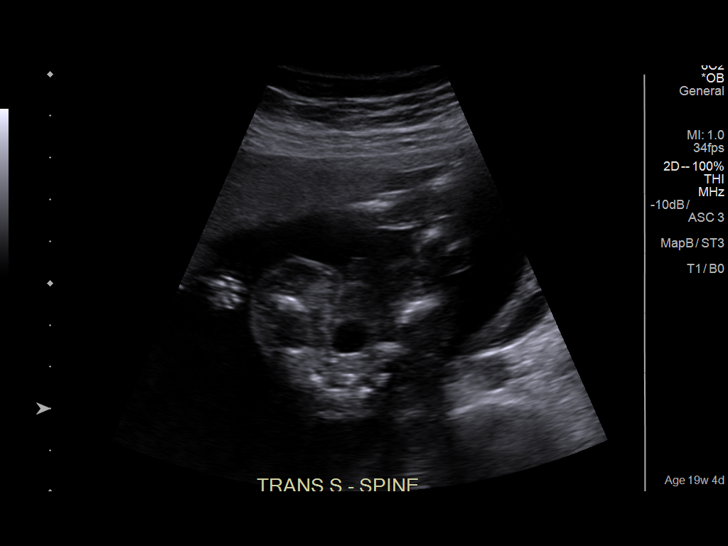
[im 86/86]
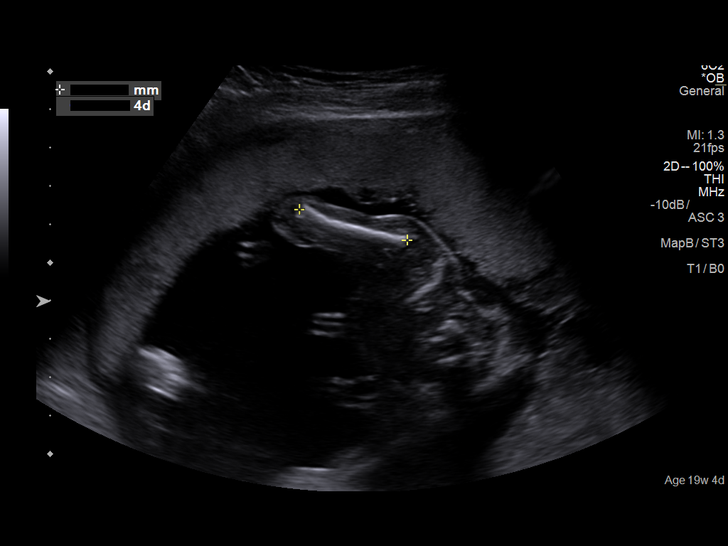

[14 of 28 positions shown; findings below may reference images not displayed]

Canned report from images found in remote index.

Refer to host system for actual result text.

## 2018-09-28 ENCOUNTER — Encounter: Payer: Self-pay | Admitting: Emergency Medicine

## 2018-09-28 ENCOUNTER — Emergency Department
Admission: EM | Admit: 2018-09-28 | Discharge: 2018-09-28 | Disposition: A | Discharge status: Home / Self Care | Payer: Self-pay | Attending: Emergency Medicine | Admitting: Emergency Medicine

## 2018-09-28 ENCOUNTER — Other Ambulatory Visit: Payer: Self-pay

## 2018-09-28 ENCOUNTER — Emergency Department: Payer: Self-pay

## 2018-09-28 DIAGNOSIS — Z720 Tobacco use: Secondary | ICD-10-CM

## 2018-09-28 DIAGNOSIS — B002 Herpesviral gingivostomatitis and pharyngotonsillitis: Secondary | ICD-10-CM | POA: Insufficient documentation

## 2018-09-28 DIAGNOSIS — J111 Influenza due to unidentified influenza virus with other respiratory manifestations: Secondary | ICD-10-CM | POA: Insufficient documentation

## 2018-09-28 DIAGNOSIS — F17200 Nicotine dependence, unspecified, uncomplicated: Secondary | ICD-10-CM | POA: Insufficient documentation

## 2018-09-28 DIAGNOSIS — R69 Illness, unspecified: Secondary | ICD-10-CM

## 2018-09-28 LAB — BASIC METABOLIC PANEL
Anion gap: 10 (ref 5–15)
BUN: 14 mg/dL (ref 6–20)
CO2: 23 mmol/L (ref 22–32)
Calcium: 9.3 mg/dL (ref 8.9–10.3)
Chloride: 106 mmol/L (ref 98–111)
Creatinine, Ser: 0.78 mg/dL (ref 0.44–1.00)
GFR calc Af Amer: >60 mL/min (ref >60)
GFR calc non Af Amer: >60 mL/min (ref >60)
Glucose, Bld: 97 mg/dL (ref 70–99)
Potassium: 3.6 mmol/L (ref 3.5–5.1)
Sodium: 139 mmol/L (ref 135–145)

## 2018-09-28 LAB — CBC
HCT: 44.3 % (ref 36.0–46.0)
Hemoglobin: 14.9 g/dL (ref 12.0–15.0)
MCH: 29.7 pg (ref 26.0–34.0)
MCHC: 33.6 g/dL (ref 30.0–36.0)
MCV: 88.2 fL (ref 80.0–100.0)
Platelets: 237 10*3/uL (ref 150–400)
RBC: 5.02 MIL/uL (ref 3.87–5.11)
RDW: 13.2 % (ref 11.5–15.5)
WBC: 8.5 10*3/uL (ref 4.0–10.5)
nRBC: 0 % (ref 0.0–0.2)

## 2018-09-28 LAB — INFLUENZA PANEL BY PCR (TYPE A & B)
Influenza A By PCR: NEGATIVE
Influenza B By PCR: NEGATIVE

## 2018-09-28 LAB — TROPONIN I: Troponin I: <0.03 ng/mL (ref <0.03)

## 2018-09-28 MED ORDER — ONDANSETRON 4 MG PO TBDP: 4.0000 mg | ORAL_TABLET | Freq: Three times a day (TID) | ORAL | 0 refills | Status: DC | PRN

## 2018-09-28 MED ORDER — ALBUTEROL SULFATE HFA 108 (90 BASE) MCG/ACT IN AERS: 2.0000 | INHALATION_SPRAY | Freq: Four times a day (QID) | RESPIRATORY_TRACT | 0 refills | Status: DC | PRN

## 2018-09-28 MED ORDER — KETOROLAC TROMETHAMINE 10 MG PO TABS
10.0000 mg | ORAL_TABLET | Freq: Once | ORAL | Status: AC
  Administered 2018-09-28: 10 mg via ORAL
  Filled 2018-09-28 (×2): qty 1

## 2018-09-28 MED ORDER — ONDANSETRON 4 MG PO TBDP
4.0000 mg | ORAL_TABLET | Freq: Once | ORAL | Status: AC
  Administered 2018-09-28: 4 mg via ORAL
  Filled 2018-09-28: qty 1

## 2018-09-28 MED ORDER — IPRATROPIUM-ALBUTEROL 0.5-2.5 (3) MG/3ML IN SOLN
3.0000 mL | Freq: Once | RESPIRATORY_TRACT | Status: AC
  Administered 2018-09-28: 3 mL via RESPIRATORY_TRACT
  Filled 2018-09-28: qty 3

## 2018-09-28 MED ORDER — ACYCLOVIR 400 MG PO TABS: 400.0000 mg | ORAL_TABLET | Freq: Three times a day (TID) | ORAL | 0 refills | Status: AC

## 2018-09-28 NOTE — ED Triage Notes (Signed)
Pt reports that she has been having a productive cough (green Phlem) since Saturday. She is having chest pressure and Shob.

## 2018-09-28 NOTE — Discharge Instructions (Addendum)
Patient plenty of fluid to stay well-hydrated.  You may take albuterol if you have coughing spasms or wheezing.  Please try to decrease the number of cigarettes that you smoke per day, or quit smoking altogether.  Plenty of fluid to stay well-hydrated.  You may take Zofran for nausea or vomiting.  Make sure that you are washing your hands frequently and thoroughly to prevent the spread of infection.  You may take acyclovir for the cold sores around your mouth; this medication works best very early in infection and may not improve your symptoms.  Return to the emergency department if you develop severe pain, lightheadedness or fever, shortness of breath, inability to keep down fluids, or any other symptoms concerning to you.

## 2018-09-28 NOTE — ED Notes (Signed)
AAOx3.  Skin warm and dry.  NAD 

## 2018-09-28 NOTE — ED Provider Notes (Addendum)
[facility] Emergency Department Provider Note  ____________________________________________  Time seen: Approximately 4:31 PM  I have reviewed the triage vital signs and the nursing notes.   HISTORY  Chief Complaint Chest Pain    HPI Pamela [name] is a 27 y.o. female with ongoing tobacco abuse presenting with cough, congestion and rhinorrhea, nausea vomiting and diarrhea, fever.  The patient reports that she has been feeling poorly for the past 2 weeks.  Initially, she had fevers associated with nonproductive cough, congestion when laying down.  She felt better over the weekend, but then since Monday has continued to feel worse, now with continued cough now productive of green phlegm, and associated chest pressure that only occurs during cough, nausea vomiting and diarrhea.  She has also had body aches and shaking chills this week.  No abdominal pain.  She did not get her influenza vaccination this year.  Pamela [name], the patient is concerned about some lesions that have erupted around the right bottom lip.  She states that she when she was ninth grade, every time she gets a fever she gets these eruptions.  She has never been treated for oral herpes in the past.  Past Medical History:  Diagnosis Date  . Heart murmur 1993   Resolved at age 89  . Medical history non-contributory     Patient Active Problem List   Diagnosis Date Noted  . Labor and delivery, indication for care 04/06/2016  . First trimester screening 09/23/2015    Past Surgical History:  Procedure Laterality Date  . CESAREAN SECTION WITH BILATERAL TUBAL LIGATION N/A 04/06/2016   Procedure: CESAREAN SECTION WITH BILATERAL TUBAL LIGATION;  Surgeon: Christeen DouglasBethany Beasley, MD;  Location: [facility] ORS;  Service: Obstetrics;  Laterality: N/A;  . WISDOM TOOTH EXTRACTION  August 2015    Current Outpatient Rx  . Order #: [id number] Class: Print  . Order #: [id number] Class: Print  . Order #: [id number] Class: Print  .  Order #: [id number] Class: Print  . Order #: [id number] Class: Print  . Order #: [id number] Class: Print  . Order #: [id number] Class: Historical Med  . Order #: [id number] Class: Historical Med    Allergies Sulfa antibiotics and Peroxyl [hydrogen peroxide-benzy alc]  History reviewed. No pertinent family history.  Social History Social History   Tobacco Use  . Smoking status: Current Every Day Smoker    Packs/day: 0.50  . Smokeless tobacco: Never Used  Substance Use Topics  . Alcohol use: No  . Drug use: No    Review of Systems Constitutional: Has a fever and chills.  Positive myalgias. Eyes: No visual changes.  No eye discharge. ENT: No sore throat.  + congestion.  No ear pain. Cardiovascular: Denies chest pain. Denies palpitations. Respiratory: Denies shortness of breath.  + productive cough. Gastrointestinal: No abdominal pain.  +nausea, +vomiting.  +diarrhea.  No constipation. Genitourinary: Negative for dysuria. Musculoskeletal: Negative for back pain. Skin: + for rash.   Neurological: Negative for headaches. No focal numbness, tingling or weakness.     ____________________________________________   PHYSICAL EXAM:  VITAL SIGNS: ED Triage Vitals  Enc Vitals Group     BP 09/28/18 1357 (!) 141/85     Pulse Rate 09/28/18 1357 (!) 107     Resp 09/28/18 1357 20     Temp 09/28/18 1357 98.7 F (37.1 C)     Temp Source 09/28/18 1357 Oral     SpO2 09/28/18 1357 98 %     Weight 09/28/18 1354 155 lb (70.3 kg)  Height 09/28/18 1354 5\' 3"  (1.6 m)     Head Circumference --      Peak Flow --      Pain Score 09/28/18 1354 6     Pain Loc --      Pain Edu? --      Excl. in GC? --     Constitutional: Alert and oriented.  Answers questions appropriately. Eyes: Conjunctivae are normal.  EOMI. No scleral icterus.  No eye discharge. Head: Atraumatic. Nose: + congestion without active rhinorrhea. EARS: No erythema, bulge or fluid behind the TMs.  The canals are clear as  well. Mouth/Throat: Mucous membranes are moist.  No posterior pharyngeal erythema, tonsillar swelling or exudate.  The posterior palate is symmetric and the uvula is midline.  There is no stridor, hoarse voice or drooling.Positive multi vesicular rash in the right bottom lip that is typical for oral herpes. Neck: No stridor.  Supple.  No meningismus. Cardiovascular: Normal rate, regular rhythm. No murmurs, rubs or gallops.  Respiratory: Normal respiratory effort.  No accessory muscle use or retractions. Lungs CTAB.  Mild end expiratory wheezing without rales or rhonchi.  The patient is able to speak in full sentences without difficulty.  Her O2 sats are 98% on room air in the emergency department. Gastrointestinal: Soft, nontender and nondistended.  No guarding or rebound.  No peritoneal signs. Musculoskeletal: No LE edema. No ttp in the calves or palpable cords.  Negative Homan's sign. Neurologic:  A&Ox3.  Speech is clear.  Face and smile are symmetric.  EOMI.  Moves all extremities well. Skin:  Skin is warm, dry and intact. No rash noted. Psychiatric: Mood and affect are normal. Speech and behavior are normal.  Normal judgement. ____________________________________________   LABS (all labs ordered are listed, but only abnormal results are displayed)  Labs Reviewed  BASIC METABOLIC PANEL  CBC  TROPONIN I  INFLUENZA PANEL BY PCR (TYPE A & B)  POC URINE PREG, ED   ____________________________________________  EKG  ED ECG REPORT I, Anne-Caroline Sharma Covert, the attending physician, personally viewed and interpreted this ECG.   Date: 09/28/2018  EKG Time: 1353  Rate: 108  Rhythm: sinus tachycardia  Axis: normal  Intervals:none  ST&T Change: No STEMI  ____________________________________________  RADIOLOGY  Dg Chest 2 View  Result Date: 09/28/2018 CLINICAL DATA:  Cough for 2 weeks. Chest pain and shortness of breath. EXAM: CHEST - 2 VIEW COMPARISON:  None. FINDINGS: Lungs are  clear. Heart size and pulmonary vascularity are normal. No adenopathy. No pneumothorax. No bone lesions. IMPRESSION: No edema or consolidation. Electronically Signed   By: Bretta Bang III M.D.   On: 09/28/2018 14:19    ____________________________________________   PROCEDURES  Procedure(s) performed: None  Procedures  Critical Care performed: No ____________________________________________   INITIAL IMPRESSION / ASSESSMENT AND PLAN / ED COURSE  Pertinent labs & imaging results that were available during my care of the patient were reviewed by me and considered in my medical decision making (see chart for details).  27 y.o. female smoker with 2 weeks of cough and cold symptoms, fever and chills, body aches, nausea vomiting and diarrhea.  Overall, the patient symptoms are most consistent with a flulike illness.  Her vital signs are suggestive of some mild dehydration so we will do oral rehydration.  The patient's laboratory studies are reassuring with a normal white blood cell count, normal blood counts, normal electrolytes.  She has an EKG that does not show any ischemic changes or arrhythmias and a  troponin that is negative; her risk for ACS or MI is very low today.  The patient's chest x-ray does not show any acute process, including pneumonia.  Plan reevaluation for final disposition.  ----------------------------------------- 5:33 PM on 09/28/2018 -----------------------------------------  The patient is feeling better at this time.  Her influenza testing is negative.  I will plan to discharge the patient home with treatment for her oral HSV and symptomatic treatment for her influenza-like illness.  Follow-up instructions as well as return precautions were discussed.  ____________________________________________  FINAL CLINICAL IMPRESSION(S) / ED DIAGNOSES  Final diagnoses:  Oral herpes  Tobacco abuse  Influenza-like illness         NEW MEDICATIONS STARTED DURING  THIS VISIT:  New Prescriptions   ACYCLOVIR (ZOVIRAX) 400 MG TABLET    Take 1 tablet (400 mg total) by mouth 3 (three) times daily for 5 days.   ALBUTEROL (PROVENTIL HFA;VENTOLIN HFA) 108 (90 BASE) MCG/ACT INHALER    Inhale 2 puffs into the lungs every 6 (six) hours as needed for wheezing or shortness of breath.   ONDANSETRON (ZOFRAN ODT) 4 MG DISINTEGRATING TABLET    Take 1 tablet (4 mg total) by mouth every 8 (eight) hours as needed for nausea or vomiting.      Rockne Menghini, MD 09/28/18 1638    Rockne Menghini, MD 09/28/18 [id number]

## 2019-10-07 IMAGING — CR DG CHEST 2V
1 series · 2 of 2 positions shown · non-contrast
Comparison: None.

CLINICAL DATA: Cough for 2 weeks. Chest pain and shortness of
breath.

EXAM:
CHEST - 2 VIEW

[Series 1: dg chest 2 view · 0.14mm/px · 2 of 2 slices shown]
[im 1/2]
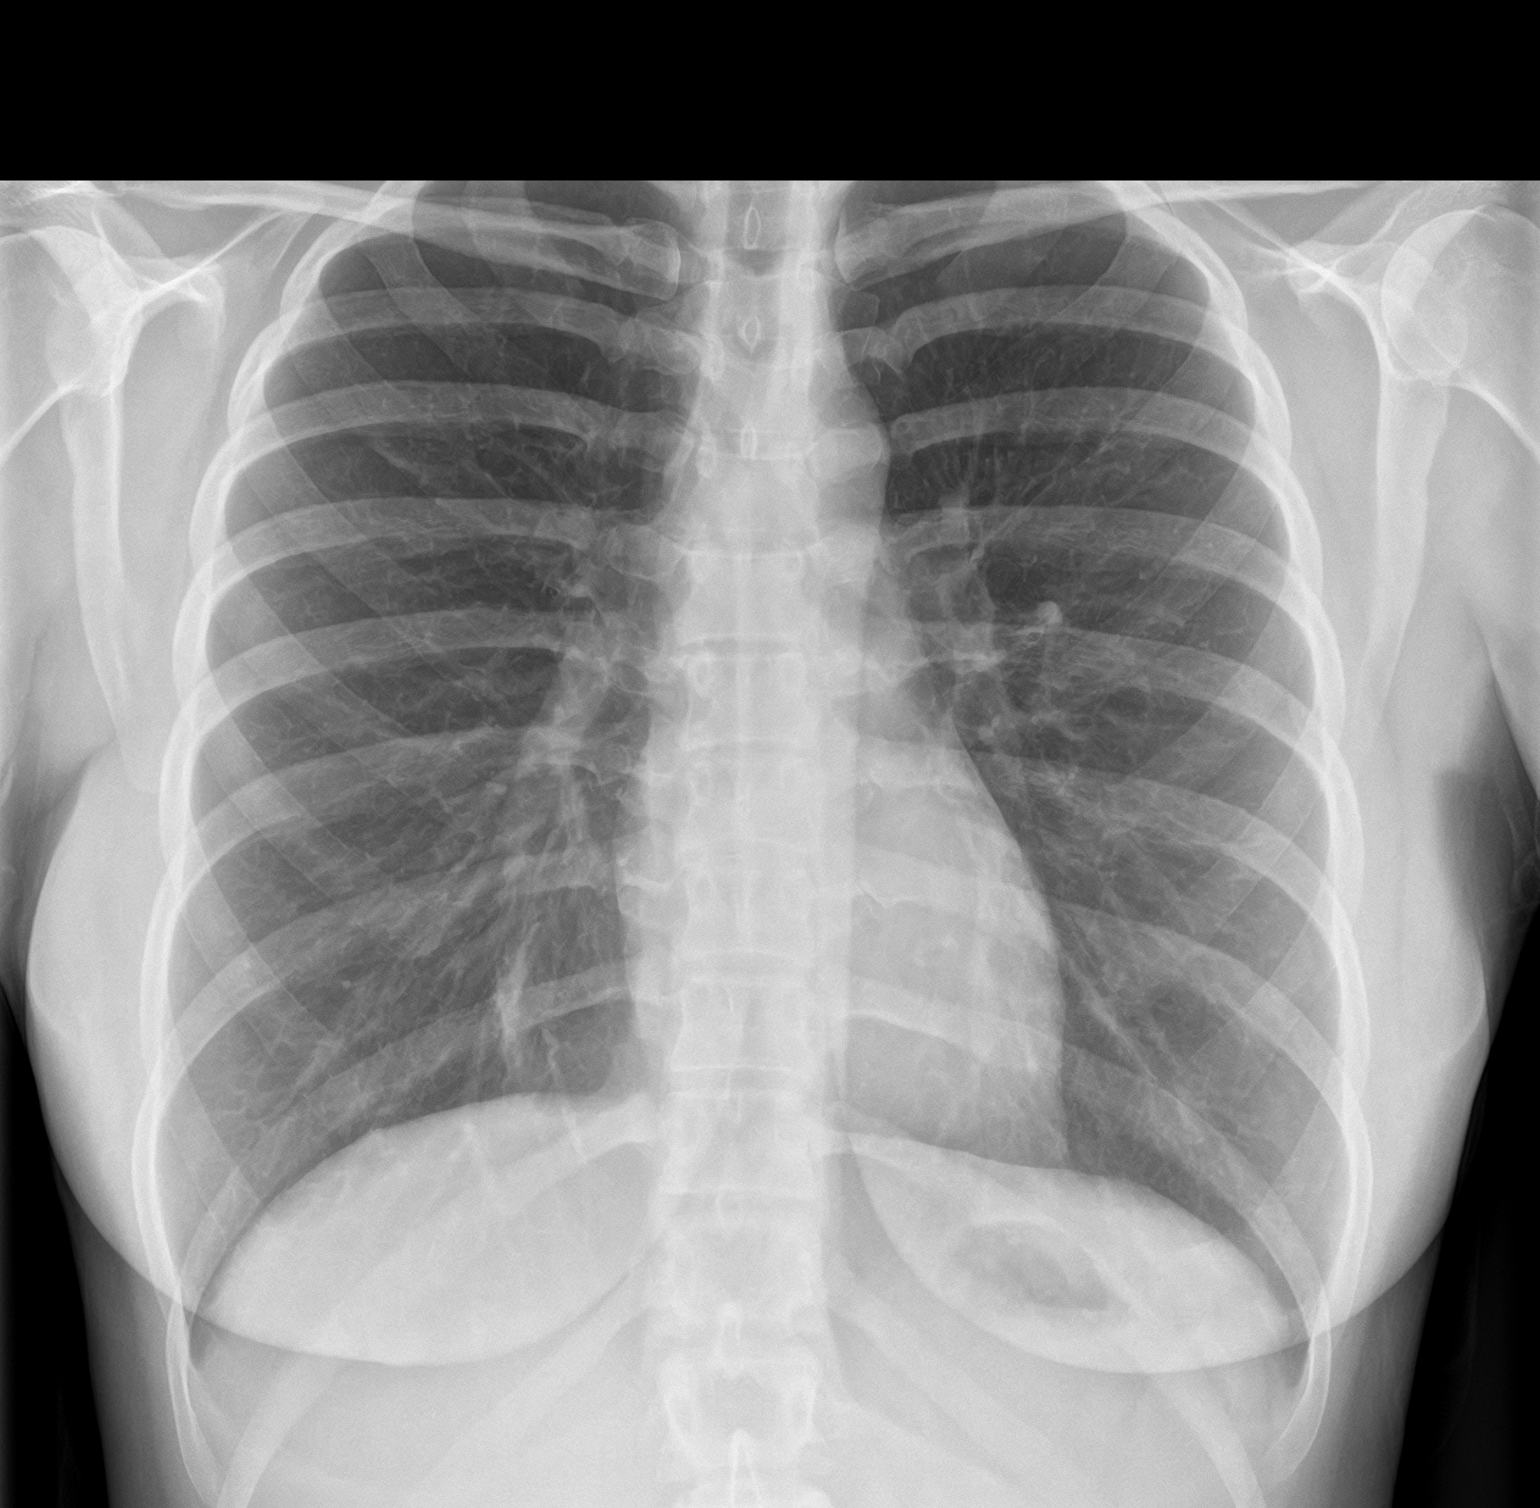
[im 2/2]
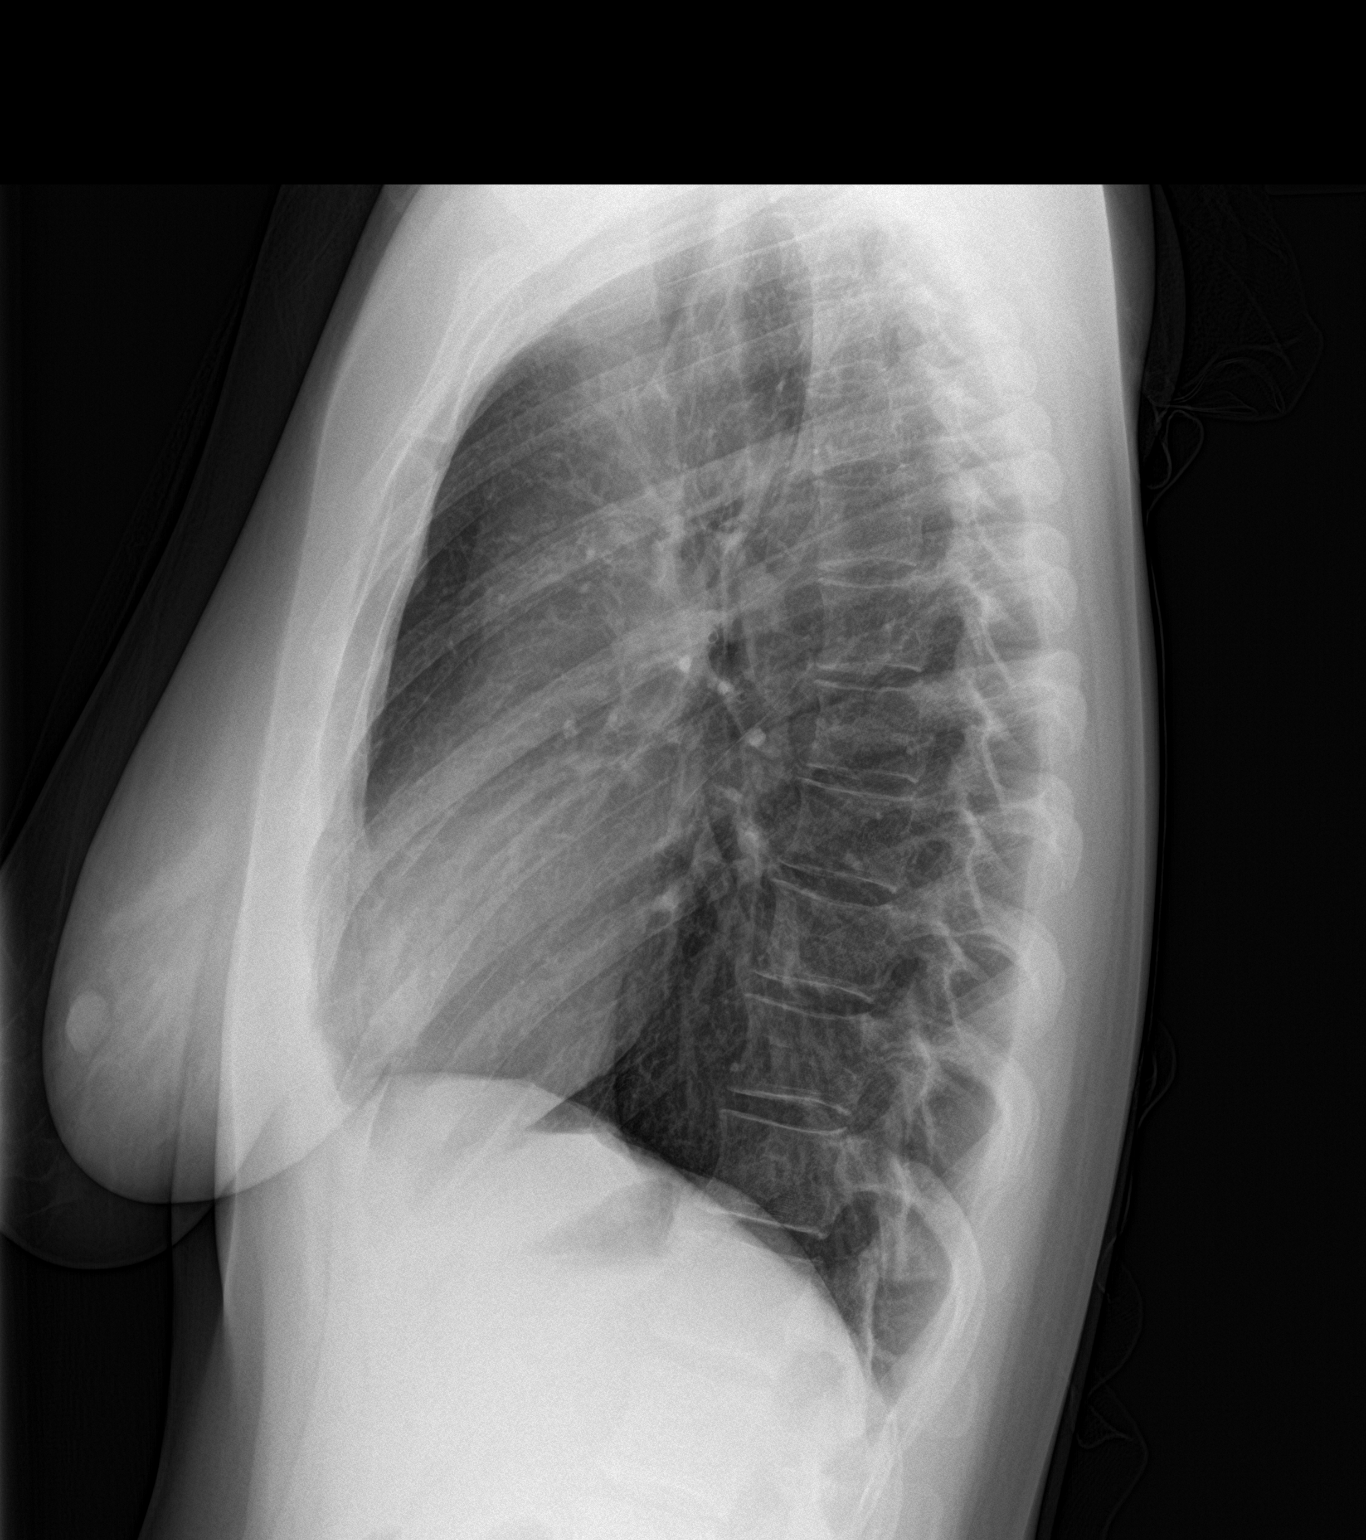

[2 of 2 positions shown; findings below may reference images not displayed]

FINDINGS: Lungs are clear. Heart size and pulmonary vascularity are normal. No
adenopathy. No pneumothorax. No bone lesions..
IMPRESSION: No edema or consolidation.

## 2022-11-23 ENCOUNTER — Ambulatory Visit
Admission: RE | Admit: 2022-11-23 | Discharge: 2022-11-23 | Disposition: A | Discharge status: Home / Self Care | Payer: Self-pay | Source: Ambulatory Visit

## 2023-02-01 NOTE — Progress Notes (Unsigned)
     I,Pamela [name],acting as a Neurosurgeon for [employer], PA-C.,have documented all relevant documentation on the behalf of Pamela Ferguson, PA-C,as directed by  Pamela Ferguson, PA-C while in the presence of Pamela Ferguson, PA-C.   Established patient visit   Patient: Pamela [name]   DOB: 05-Jun-1991   32 y.o. Female  MRN: [id number] Visit Date: 02/02/2023  Today's healthcare provider: [name], PA-C   No chief complaint on file.  Subjective    HPI  Well Adult Physical: Patient here for a comprehensive physical exam.The patient reports {problems:16946} Do you take any herbs or supplements that were not prescribed by a doctor? {yes/no/not asked:9010} Are you taking calcium supplements? {yes/no:63} Are you taking aspirin daily? {yes/no:63} GU History: {gu history select gender:17814}   Medications: Outpatient Medications Prior to Visit  Medication Sig   albuterol (PROVENTIL HFA;VENTOLIN HFA) 108 (90 Base) MCG/ACT inhaler Inhale 2 puffs into the lungs every 6 (six) hours as needed for wheezing or shortness of breath.   ferrous sulfate 325 (65 FE) MG tablet Take 1 tablet (325 mg total) by mouth daily with breakfast.   ibuprofen (ADVIL,MOTRIN) 600 MG tablet Take 1 tablet (600 mg total) by mouth every 6 (six) hours as needed for mild pain.   ondansetron (ZOFRAN ODT) 4 MG disintegrating tablet Take 1 tablet (4 mg total) by mouth every 8 (eight) hours as needed for nausea or vomiting.   ondansetron (ZOFRAN ODT) 4 MG disintegrating tablet Take 1 tablet (4 mg total) by mouth every 8 (eight) hours as needed for nausea or vomiting.   oxyCODONE-acetaminophen (PERCOCET/ROXICET) 5-325 MG tablet Take 1-2 tablets by mouth every 4 (four) hours as needed for moderate pain or severe pain.   Prenatal Vit-Fe Fumarate-FA (PRENATAL MULTIVITAMIN) TABS tablet Take 1 tablet by mouth daily at 12 noon.   ranitidine (ZANTAC) 150 MG tablet Take 150 mg by mouth daily as needed for heartburn.   No  facility-administered medications prior to visit.    Review of Systems  {Labs  Heme  Chem  Endocrine  Serology  Results Review (optional):23779}   Objective    There were no vitals taken for this visit. {Show previous vital signs (optional):23777}  Physical Exam  ***  No results found for any visits on 02/02/23.  Assessment & Plan     ***  No follow-ups on file.      {provider attestation***:1}   Pamela Ferguson, PA-C  [facility] [phone] (phone) [phone] (fax)  [facility]

## 2023-02-02 ENCOUNTER — Ambulatory Visit: Payer: Commercial Managed Care - PPO | Admitting: Physician Assistant

## 2023-02-02 ENCOUNTER — Encounter: Payer: Self-pay | Admitting: Physician Assistant

## 2023-02-02 VITALS — BP 114/87 | HR 99 | Ht 63.0 in | Wt 149.4 lb

## 2023-02-02 DIAGNOSIS — R42 Dizziness and giddiness: Secondary | ICD-10-CM

## 2023-02-02 DIAGNOSIS — E611 Iron deficiency: Secondary | ICD-10-CM

## 2023-02-02 DIAGNOSIS — F32A Depression, unspecified: Secondary | ICD-10-CM | POA: Insufficient documentation

## 2023-02-02 DIAGNOSIS — Z114 Encounter for screening for human immunodeficiency virus [HIV]: Secondary | ICD-10-CM | POA: Diagnosis not present

## 2023-02-02 DIAGNOSIS — F419 Anxiety disorder, unspecified: Secondary | ICD-10-CM | POA: Diagnosis not present

## 2023-02-02 DIAGNOSIS — Z1159 Encounter for screening for other viral diseases: Secondary | ICD-10-CM

## 2023-02-02 MED ORDER — ESCITALOPRAM OXALATE 10 MG PO TABS: 10.0000 mg | ORAL_TABLET | Freq: Every day | ORAL | 1 refills | Status: DC

## 2023-02-02 NOTE — Assessment & Plan Note (Signed)
Will check cbc, iron panel

## 2023-02-02 NOTE — Assessment & Plan Note (Signed)
Discussed medication, therapy. Referral to psychology  Starting on lexapro 10 mg. Advised SE  Advised given cocurrent depression additional meds are sometimes needed, to monitor symptoms F/u 4 weeks

## 2023-02-03 LAB — COMPREHENSIVE METABOLIC PANEL
ALT: 16 IU/L (ref 0–32)
AST: 19 IU/L (ref 0–40)
Albumin/Globulin Ratio: 1.8 (ref 1.2–2.2)
Albumin: 4.7 g/dL (ref 4.0–5.0)
Alkaline Phosphatase: 52 IU/L (ref 44–121)
BUN/Creatinine Ratio: 21 (ref 9–23)
BUN: 16 mg/dL (ref 6–20)
Bilirubin Total: 0.5 mg/dL (ref 0.0–1.2)
CO2: 19 mmol/L — ABNORMAL LOW (ref 20–29)
Calcium: 9.7 mg/dL (ref 8.7–10.2)
Chloride: 103 mmol/L (ref 96–106)
Creatinine, Ser: 0.76 mg/dL (ref 0.57–1.00)
Globulin, Total: 2.6 g/dL (ref 1.5–4.5)
Glucose: 84 mg/dL (ref 70–99)
Potassium: 4.4 mmol/L (ref 3.5–5.2)
Sodium: 139 mmol/L (ref 134–144)
Total Protein: 7.3 g/dL (ref 6.0–8.5)
eGFR: 108 mL/min/{1.73_m2} (ref >59)

## 2023-02-03 LAB — LIPID PANEL
Chol/HDL Ratio: 2.2 ratio (ref 0.0–4.4)
Cholesterol, Total: 152 mg/dL (ref 100–199)
HDL: 69 mg/dL (ref >39)
LDL Chol Calc (NIH): 73 mg/dL (ref 0–99)
Triglycerides: 46 mg/dL (ref 0–149)
VLDL Cholesterol Cal: 10 mg/dL (ref 5–40)

## 2023-02-03 LAB — CBC WITH DIFFERENTIAL/PLATELET
Basophils Absolute: 0 10*3/uL (ref 0.0–0.2)
Basos: 1 %
EOS (ABSOLUTE): 0.1 10*3/uL (ref 0.0–0.4)
Eos: 2 %
Hematocrit: 42.8 % (ref 34.0–46.6)
Hemoglobin: 14.5 g/dL (ref 11.1–15.9)
Immature Grans (Abs): 0 10*3/uL (ref 0.0–0.1)
Immature Granulocytes: 0 %
Lymphocytes Absolute: 1.9 10*3/uL (ref 0.7–3.1)
Lymphs: 31 %
MCH: 31.2 pg (ref 26.6–33.0)
MCHC: 33.9 g/dL (ref 31.5–35.7)
MCV: 92 fL (ref 79–97)
Monocytes Absolute: 0.4 10*3/uL (ref 0.1–0.9)
Monocytes: 6 %
Neutrophils Absolute: 3.5 10*3/uL (ref 1.4–7.0)
Neutrophils: 60 %
Platelets: 206 10*3/uL (ref 150–450)
RBC: 4.65 x10E6/uL (ref 3.77–5.28)
RDW: 13 % (ref 11.7–15.4)
WBC: 6 10*3/uL (ref 3.4–10.8)

## 2023-02-03 LAB — HEMOGLOBIN A1C
Est. average glucose Bld gHb Est-mCnc: 94 mg/dL
Hgb A1c MFr Bld: 4.9 % (ref 4.8–5.6)

## 2023-02-03 LAB — IRON,TIBC AND FERRITIN PANEL
Ferritin: 31 ng/mL (ref 15–150)
Iron Saturation: 48 % (ref 15–55)
Iron: 177 ug/dL — ABNORMAL HIGH (ref 27–159)
Total Iron Binding Capacity: 366 ug/dL (ref 250–450)
UIBC: 189 ug/dL (ref 131–425)

## 2023-02-03 LAB — HIV ANTIBODY (ROUTINE TESTING W REFLEX): HIV Screen 4th Generation wRfx: NONREACTIVE

## 2023-02-03 LAB — TSH+FREE T4
Free T4: 1.23 ng/dL (ref 0.82–1.77)
TSH: 1.45 u[IU]/mL (ref 0.450–4.500)

## 2023-02-03 LAB — HEPATITIS C ANTIBODY: Hep C Virus Ab: NONREACTIVE

## 2023-03-10 ENCOUNTER — Other Ambulatory Visit (HOSPITAL_COMMUNITY)
Admission: RE | Admit: 2023-03-10 | Discharge: 2023-03-10 | Disposition: A | Discharge status: Home / Self Care | Payer: Commercial Managed Care - PPO | Source: Ambulatory Visit | Attending: Physician Assistant | Admitting: Physician Assistant

## 2023-03-10 ENCOUNTER — Encounter: Payer: Self-pay | Admitting: Physician Assistant

## 2023-03-10 ENCOUNTER — Ambulatory Visit (INDEPENDENT_AMBULATORY_CARE_PROVIDER_SITE_OTHER): Payer: Commercial Managed Care - PPO | Admitting: Physician Assistant

## 2023-03-10 VITALS — BP 117/77 | HR 66 | Temp 97.8°F | Resp 12 | Ht 64.0 in | Wt 158.8 lb

## 2023-03-10 DIAGNOSIS — Z113 Encounter for screening for infections with a predominantly sexual mode of transmission: Secondary | ICD-10-CM | POA: Diagnosis not present

## 2023-03-10 DIAGNOSIS — Z124 Encounter for screening for malignant neoplasm of cervix: Secondary | ICD-10-CM | POA: Diagnosis not present

## 2023-03-10 DIAGNOSIS — F32A Depression, unspecified: Secondary | ICD-10-CM

## 2023-03-10 DIAGNOSIS — F419 Anxiety disorder, unspecified: Secondary | ICD-10-CM

## 2023-03-10 DIAGNOSIS — Z0000 Encounter for general adult medical examination without abnormal findings: Secondary | ICD-10-CM | POA: Diagnosis not present

## 2023-03-10 MED ORDER — BUSPIRONE HCL 5 MG PO TABS: 5.0000 mg | ORAL_TABLET | Freq: Two times a day (BID) | ORAL | 1 refills | Status: DC

## 2023-03-10 NOTE — Progress Notes (Signed)
I,Pamela [name],acting as a Neurosurgeon for [employer], PA-C.,have documented all relevant documentation on the behalf of Pamela Ferguson, PA-C,as directed by  Pamela Ferguson, PA-C while in the presence of Pamela Ferguson, PA-C.    Complete physical exam   Patient: Pamela [name]   DOB: 1991/01/09   31 y.o. Female  MRN: [id number] Visit Date: 03/10/2023  Today's healthcare provider: [name], PA-C   Chief Complaint  Patient presents with   Annual Exam   Subjective    Pamela [name] is a 32 y.o. female who presents today for a complete physical exam.  She reports consuming a general diet. The patient does not participate in regular exercise at present. She generally feels well. She reports sleeping fairly well. She does not have additional problems to discuss today.  HPI  She reports her overall mood and depressive symptoms are better, but her anxiety symptoms are untouched. Still overwhelming.   Past Medical History:  Diagnosis Date   Allergy    Anemia    Anxiety    Depression    Heart murmur 1993   Resolved at age 82   Medical history non-contributory    Substance abuse (HCC)    Past Surgical History:  Procedure Laterality Date   CESAREAN SECTION     CESAREAN SECTION WITH BILATERAL TUBAL LIGATION N/A 04/06/2016   Procedure: CESAREAN SECTION WITH BILATERAL TUBAL LIGATION;  Surgeon: Christeen Douglas, MD;  Location: [facility] ORS;  Service: Obstetrics;  Laterality: N/A;   TUBAL LIGATION  03/2016   WISDOM TOOTH EXTRACTION  05/12/2014   Social History   Socioeconomic History   Marital status: Single    Spouse name: Not on file   Number of children: Not on file   Years of education: Not on file   Highest education level: Not on file  Occupational History   Not on file  Tobacco Use   Smoking status: Every Day    Packs/day: 0.50    Years: 15.00    Additional pack years: 0.00    Total pack years: 7.50    Types: Cigarettes   Smokeless tobacco: Never  Vaping Use    Vaping Use: Never used  Substance and Sexual Activity   Alcohol use: No   Drug use: No   Sexual activity: Yes    Birth control/protection: Other-see comments    Comment: Tubal   Other Topics Concern   Not on file  Social History Narrative   Not on file   Social Determinants of Health   Financial Resource Strain: Not on file  Food Insecurity: Not on file  Transportation Needs: Not on file  Physical Activity: Not on file  Stress: Not on file  Social Connections: Not on file  Intimate Partner Violence: Not on file   Family Status  Relation Name Status   Mother corona sanderson (Not Specified)   Father Pamela [name] (Not Specified)   Brother Ok Edwards (Not Specified)   Family History  Problem Relation Age of Onset   Anxiety disorder Mother    Depression Mother    Allergies Mother    COPD Father    Asthma Father    Allergies Father    Alcohol abuse Father    ADD / ADHD Brother    Allergies  Allergen Reactions   Sulfa Antibiotics Anaphylaxis    Scratchy throat   Hydrogen Peroxide Dermatitis    With previous use, abrasion increased in size after use   Peroxyl [Hydrogen Peroxide-Benzy Alc] Dermatitis  With previous use, abrasion increased in size after use    Patient Care Team: Pamela Ferguson, PA-C as PCP - General (Physician Assistant) Christeen Douglas, MD as Consulting Physician (Obstetrics and Gynecology)   Medications: Outpatient Medications Prior to Visit  Medication Sig   escitalopram (LEXAPRO) 10 MG tablet Take 1 tablet (10 mg total) by mouth daily.   No facility-administered medications prior to visit.    Review of Systems  Genitourinary:  Positive for frequency.  Neurological:  Positive for headaches.  Psychiatric/Behavioral:  Positive for decreased concentration and self-injury.   All other systems reviewed and are negative.   Last CBC Lab Results  Component Value Date   WBC 6.0 02/02/2023   HGB 14.5 02/02/2023   HCT 42.8 02/02/2023    MCV 92 02/02/2023   MCH 31.2 02/02/2023   RDW 13.0 02/02/2023   PLT 206 02/02/2023   Last metabolic panel Lab Results  Component Value Date   GLUCOSE 84 02/02/2023   NA 139 02/02/2023   K 4.4 02/02/2023   CL 103 02/02/2023   CO2 19 (L) 02/02/2023   BUN 16 02/02/2023   CREATININE 0.76 02/02/2023   EGFR 108 02/02/2023   CALCIUM 9.7 02/02/2023   PROT 7.3 02/02/2023   ALBUMIN 4.7 02/02/2023   LABGLOB 2.6 02/02/2023   AGRATIO 1.8 02/02/2023   BILITOT 0.5 02/02/2023   ALKPHOS 52 02/02/2023   AST 19 02/02/2023   ALT 16 02/02/2023   ANIONGAP 10 09/28/2018   Last lipids Lab Results  Component Value Date   CHOL 152 02/02/2023   HDL 69 02/02/2023   LDLCALC 73 02/02/2023   TRIG 46 02/02/2023   CHOLHDL 2.2 02/02/2023   Last hemoglobin A1c Lab Results  Component Value Date   HGBA1C 4.9 02/02/2023   Last thyroid functions Lab Results  Component Value Date   TSH 1.450 02/02/2023      Objective    BP 117/77 (BP Location: Left Arm, Patient Position: Sitting, Cuff Size: Normal)   Pulse 66   Temp 97.8 F (36.6 C) (Temporal)   Resp 12   Ht 5\' 4"  (1.626 m)   Wt 158 lb 12.8 oz (72 kg)   LMP 03/07/2023 (Exact Date)   SpO2 97%   BMI 27.26 kg/m  BP Readings from Last 3 Encounters:  03/10/23 117/77  02/02/23 114/87  09/28/18 134/78   Wt Readings from Last 3 Encounters:  03/10/23 158 lb 12.8 oz (72 kg)  02/02/23 149 lb 6.4 oz (67.8 kg)  09/28/18 155 lb (70.3 kg)   Physical Exam Constitutional:      General: She is awake.     Appearance: She is well-developed. She is not ill-appearing.  HENT:     Head: Normocephalic.     Right Ear: Tympanic membrane normal.     Left Ear: Tympanic membrane normal.     Nose: Nose normal. No congestion or rhinorrhea.     Mouth/Throat:     Pharynx: No oropharyngeal exudate or posterior oropharyngeal erythema.  Eyes:     Conjunctiva/sclera: Conjunctivae normal.     Pupils: Pupils are equal, round, and reactive to light.  Neck:      Thyroid: No thyroid mass or thyromegaly.  Cardiovascular:     Rate and Rhythm: Normal rate and regular rhythm.     Heart sounds: Normal heart sounds.  Pulmonary:     Effort: Pulmonary effort is normal.     Breath sounds: Normal breath sounds.  Chest:     Comments: B/l breasts  w/o masses, thickening, skin changes, nipple discharge or nipple inversion Abdominal:     Palpations: Abdomen is soft.     Tenderness: There is no abdominal tenderness.  Genitourinary:    Labia:        Right: No rash.        Left: No rash.      Vagina: Normal.     Cervix: Normal.     Uterus: Normal.      Adnexa: Right adnexa normal and left adnexa normal.  Musculoskeletal:     Right lower leg: No swelling. No edema.     Left lower leg: No swelling. No edema.  Lymphadenopathy:     Cervical: No cervical adenopathy.  Skin:    General: Skin is warm.  Neurological:     Mental Status: She is alert and oriented to person, place, and time.  Psychiatric:        Attention and Perception: Attention normal.        Mood and Affect: Mood normal.        Speech: Speech normal.        Behavior: Behavior normal. Behavior is cooperative.      Last depression screening scores    03/10/2023    3:09 PM 02/02/2023    9:11 AM  PHQ 2/9 Scores  PHQ - 2 Score 3 5  PHQ- 9 Score 13 20   Last fall risk screening    03/10/2023    3:09 PM  Fall Risk   Falls in the past year? 0  Number falls in past yr: 0  Injury with Fall? 0  Risk for fall due to : No Fall Risks  Follow up Falls evaluation completed   Last Audit-C alcohol use screening    03/10/2023    3:09 PM  Alcohol Use Disorder Test (AUDIT)  1. How often do you have a drink containing alcohol? 0  2. How many drinks containing alcohol do you have on a typical day when you are drinking? 0  3. How often do you have six or more drinks on one occasion? 0  AUDIT-C Score 0   A score of 3 or more in women, and 4 or more in men indicates increased risk for alcohol  abuse, EXCEPT if all of the points are from question 1   No results found for any visits on 03/10/23.  Assessment & Plan    Routine Health Maintenance and Physical Exam  Exercise Activities and Dietary recommendations --balanced diet high in fiber and protein, low in sugars, carbs, fats. --physical activity/exercise 30 minutes 3-5 times a week     Immunization History  Administered Date(s) Administered   DTaP 09/19/1992, 11/21/1992, 02/04/1993, 12/15/1993, 07/26/1996   HIB (PRP-OMP) 09/19/1992, 11/21/1992, 02/04/1993   Hepatitis B, PED/ADOLESCENT 10/18/1991, 09/19/1992, 05/05/1993   MMR 12/15/1993, 07/26/1996   OPV 09/19/1992, 11/21/1992, 12/15/1993, 07/26/1996   Tdap 08/04/2013, 01/15/2016    Health Maintenance  Topic Date Due   COVID-19 Vaccine (1) Never done   PAP SMEAR-Modifier  Never done   INFLUENZA VACCINE  05/13/2023   DTaP/Tdap/Td (8 - Td or Tdap) 01/14/2026   Hepatitis C Screening  Completed   HIV Screening  Completed   HPV VACCINES  Aged Out    Discussed health benefits of physical activity, and encouraged her to engage in regular exercise appropriate for her age and condition.  Problem List Items Addressed This Visit       Other   Anxiety and depression    Cont  lexapro 10 mg  Adding buspar 5 mg BID  F/u 4 weeks      Relevant Medications   busPIRone (BUSPAR) 5 MG tablet   Other Visit Diagnoses     Annual physical exam    -  Primary   Routine screening for STI (sexually transmitted infection)       Relevant Orders   Cervicovaginal ancillary only   Cervical cancer screening       Relevant Orders   Cytology - PAP        Return in about 4 weeks (around 04/07/2023) for anxiety.     I, Pamela Ferguson, PA-C have reviewed all documentation for this visit. The documentation on  03/10/23   for the exam, diagnosis, procedures, and orders are all accurate and complete.  Pamela Ferguson, PA-C [facility] [address]  [address] [place], [place], [address] Office: [phone] Fax: [phone]   [facility]

## 2023-03-10 NOTE — Assessment & Plan Note (Signed)
Cont lexapro 10 mg  Adding buspar 5 mg BID  F/u 4 weeks

## 2023-03-12 LAB — CERVICOVAGINAL ANCILLARY ONLY
Chlamydia: NEGATIVE
Comment: NEGATIVE
Comment: NEGATIVE
Comment: NORMAL
Neisseria Gonorrhea: NEGATIVE
Trichomonas: NEGATIVE

## 2023-03-15 ENCOUNTER — Encounter: Payer: Self-pay | Admitting: Physician Assistant

## 2023-03-15 LAB — CYTOLOGY - PAP: Diagnosis: NEGATIVE

## 2023-04-05 ENCOUNTER — Ambulatory Visit (INDEPENDENT_AMBULATORY_CARE_PROVIDER_SITE_OTHER): Payer: Commercial Managed Care - PPO | Admitting: Clinical

## 2023-04-05 DIAGNOSIS — F411 Generalized anxiety disorder: Secondary | ICD-10-CM | POA: Diagnosis not present

## 2023-04-05 NOTE — Progress Notes (Signed)
                Bird Swetz, LCSW 

## 2023-04-05 NOTE — Progress Notes (Signed)
[facility] Counselor Initial Adult Exam  Name: Pamela [name] Date: 04/05/2023 MRN: [id number] DOB: 1991-01-23 PCP: [name], PA-C  Time spent: 1:33pm - 2:08pm   Guardian/Payee:  NA    Paperwork requested:  NA  Reason for Visit /Presenting Problem: Patient stated, "I feel like I've been on a down spiral for a while now". Patient reported her brother committed suicide 3 years ago and the relationship with her significant other of 10 years ended 2 years ago.   Mental Status Exam: Appearance:   Neat and Well Groomed     Behavior:  Appropriate  Motor:  Normal  Speech/Language:   Clear and Coherent  Affect:  Appropriate  Mood:  anxious  Thought process:  normal  Thought content:    WNL  Sensory/Perceptual disturbances:    WNL  Orientation:  oriented to person, place, and situation  Attention:  Good  Concentration:  Good  Memory:  WNL  Fund of knowledge:   Good  Insight:    Good  Judgment:   Good  Impulse Control:  Good   Reported Symptoms:  Patient reported anxiety, heart rate increases when feeling anxious, stated "feels like a pit in my stomach", worry, stated "any decision make I have I over think that too much", feeling on edge, irritability, difficulty staying asleep, decreased concentration, difficulty completing tasks, puts off tasks, difficulty sitting still at times, unorganized, difficulty finding items, easily distracted. Patient reported she was diagnosed with anxiety and post partum depression in 2015. Patient reported the anxiety has become "unmanageable". Patient reported no current symptoms of depression.   Risk Assessment: Danger to Self:  No Patient denied current and past suicidal ideation. Patient reported no current symptoms of psychosis.  Self-injurious Behavior: No Danger to Others: No Patient denied current and past homicidal ideation Duty to Warn:no Physical Aggression / Violence:No  Access to Firearms a concern: No  Gang  Involvement:No  Patient / guardian was educated about steps to take if suicide or homicide risk level increases between visits: yes While future psychiatric events cannot be accurately predicted, the patient does not currently require acute inpatient psychiatric care and does not currently meet [facility] involuntary commitment criteria.  Substance Abuse History: Current substance abuse: Yes   Patient reported current tobacco use of 1/2 - 1 pack of cigarettes per day, with last use today. Patient reported no current or past alcohol use. Patient reported no current drug use. Patient reported a history of using marijuana daily during high school for 2-3 years. Patient reported a history of paranoia when using marijuana in the past. Patient reported last marijuana use was once 1 and 1/2 years ago.   Past Psychiatric History:   Previous psychological history is significant for anxiety and depression Outpatient Providers: none History of Psych Hospitalization: No  Psychological Testing:  none. Patient reported she was diagnosed with ADHD as a child    Abuse History:  Victim of: No.,  none    Report needed: No. Victim of Neglect:No. Perpetrator of  none   Witness / Exposure to Domestic Violence: No   Protective Services Involvement: No  Witness to [employer] Violence:  No   Family History:  Family History  Problem Relation Age of Onset   Anxiety disorder Mother    Depression Mother    Allergies Mother    COPD Father    Asthma Father    Allergies Father    Alcohol abuse Father    ADD / ADHD Brother  Living situation: the patient lives with their family (lives with her mother and patient's 2 children)   Sexual Orientation: Straight  Relationship Status: single  Name of spouse / other: NA If a parent, number of children / ages: 2 children (ages 76, 74)  Support Systems: mother and best friend  Surveyor, quantity Stress:  No   Income/Employment/Disability: Employment  Social worker: No   Educational History: Education: high school diploma/GED  Religion/Sprituality/World View: none  Any cultural differences that may affect / interfere with treatment:  not applicable   Recreation/Hobbies: going to the pool, going on hikes  Stressors: Other: relationship with her children's father    Strengths: Patient stated, "being outside".   Barriers:  Patient stated, "the inability to focus"    Legal History: Pending legal issue / charges: The patient has no significant history of legal issues. History of legal issue / charges:  none  Medical History/Surgical History: reviewed Past Medical History:  Diagnosis Date   Allergy    Anemia    Anxiety    Depression    Heart murmur 1993   Resolved at age 89   Medical history non-contributory    Substance abuse (HCC)     Past Surgical History:  Procedure Laterality Date   CESAREAN SECTION     CESAREAN SECTION WITH BILATERAL TUBAL LIGATION N/A 04/06/2016   Procedure: CESAREAN SECTION WITH BILATERAL TUBAL LIGATION;  Surgeon: Christeen Douglas, MD;  Location: [facility] ORS;  Service: Obstetrics;  Laterality: N/A;   TUBAL LIGATION  03/2016   WISDOM TOOTH EXTRACTION  05/12/2014    Medications: Current Outpatient Medications  Medication Sig Dispense Refill   busPIRone (BUSPAR) 5 MG tablet Take 1 tablet (5 mg total) by mouth 2 (two) times daily. 60 tablet 1   escitalopram (LEXAPRO) 10 MG tablet Take 1 tablet (10 mg total) by mouth daily. 90 tablet 1   No current facility-administered medications for this visit.    Allergies  Allergen Reactions   Sulfa Antibiotics Anaphylaxis    Scratchy throat   Hydrogen Peroxide Dermatitis    With previous use, abrasion increased in size after use   Peroxyl [Hydrogen Peroxide-Benzy Alc] Dermatitis    With previous use, abrasion increased in size after use    Diagnoses:  Generalized anxiety disorder R/O ADHD  Plan of Care: Patient is a 32 year old female who presented for an  initial assessment. Clinician conducted initial assessment via caregility video from clinician's home office. Patient provided verbal consent to proceed with telehealth session and is aware of limitations of telephone or video visits. Patient participated in session from patient's vehicle. Patient stated, "I feel like I've been on a down spiral for a while now" when clinician inquired about reason for today's visit. Patient reported the following symptoms: anxiety, increased heart rate when feeling anxious, worry, feeling on edge, irritability, difficulty staying asleep, decreased concentration, difficulty completing tasks, puts off tasks, difficulty sitting still at times, unorganized, difficulty finding items, and is easily distracted. Patient reported she was diagnosed with anxiety and post partum depression in 2015, and reported symptoms of anxiety have become "unmanageable".  Patient denied current and past suicidal ideation and homicidal ideation. Patient reported no current symptoms of psychosis. Patient reported current tobacco use of 1/2 - 1 pack of cigarettes per day, with last use today. Patient reported no current or past alcohol use. Patient reported no current drug use. Patient reported a history of using marijuana daily for 2-3 years during high school. Patient reported a  history of paranoia when using marijuana in the past. Patient reported last marijuana use was once 1 and 1/2 years ago. Patient reported a historical diagnosis of ADHD. Patient reported no history of outpatient or inpatient psychiatric treatment. Patient reported the relationship with her children's father is a current stressor. Patient identified her mother and best friend as current supports. It is recommended patient be referred to a psychiatrist for a medication management consult and recommended patient participate in individual therapy every two weeks. In addition, an evaluation for ADHD is recommended. Clinician will review  recommendations and treatment plan with patient during follow up appointment. Treatment plan will be developed during follow up appointment.    Collaboration of Care: Primary Care Provider AEB patient requested to complete a consent for her PCP, [name], PA-C at [facility]  Patient/Guardian was advised Release of Information must be obtained prior to any record release in order to collaborate their care with an outside provider.   Doree Barthel, LCSW

## 2023-04-12 ENCOUNTER — Ambulatory Visit: Payer: Commercial Managed Care - PPO | Admitting: Physician Assistant

## 2023-04-12 VITALS — BP 124/78 | HR 75 | Ht 62.0 in | Wt 161.8 lb

## 2023-04-12 DIAGNOSIS — F419 Anxiety disorder, unspecified: Secondary | ICD-10-CM | POA: Diagnosis not present

## 2023-04-12 DIAGNOSIS — F32A Depression, unspecified: Secondary | ICD-10-CM | POA: Diagnosis not present

## 2023-04-12 MED ORDER — ESCITALOPRAM OXALATE 20 MG PO TABS: 20.0000 mg | ORAL_TABLET | Freq: Every day | ORAL | 3 refills | Status: DC

## 2023-04-12 MED ORDER — LORAZEPAM 0.5 MG PO TABS: 0.5000 mg | ORAL_TABLET | Freq: Every day | ORAL | 1 refills | Status: AC | PRN

## 2023-04-12 NOTE — Progress Notes (Unsigned)
Established patient visit   Patient: Pamela [name]   DOB: [birth year]   31 y.o. Female  MRN: [id number] Visit Date: 04/12/2023  Today's healthcare provider: [name], PA-C   Chief Complaint  Patient presents with   Anxiety    Patient was last seen for anxiety on 03/10/23. She was advised to continue lexapro 10 mg and add Buspar 5 mg BID. Patient reports she is tolerating medication adjustments. Reports her anxiety is moderate but most days it is pretty severe and unchanged since last visit. She reports having symptoms such as difficulty concentrating, feelings of losing control, panic attacks,racing thoughts, SOB during panic attacks, and excessive sweating.    Subjective    Anxiety Patient reports no chest pain, dizziness or shortness of breath.     Discussed the use of AI scribe software for clinical note transcription with the patient, who gave verbal consent to proceed.  History of Present Illness   The pt presents with worsening symptoms despite treatment with Lexapro and Buspar. They describe a lack of motivation, fatigue, and difficulty with daily tasks such as showering. They also report frequent anxiety attacks, which they describe as a 'down spiral,' and are particularly debilitating at work. Despite these symptoms, they note that their mood stability has improved.  The patient's therapist has suggested testing for ADHD due to observed fidgeting and restlessness during their sessions.   Denies improvement with Buspar.         04/12/2023    1:35 PM  GAD 7 : Generalized Anxiety Score  Nervous, Anxious, on Edge 3  Control/stop worrying 3  Worry too much - different things 3  Trouble relaxing 2  Restless 1  Easily annoyed or irritable 3  Afraid - awful might happen 0  Total GAD 7 Score 15  Anxiety Difficulty Very difficult       03/10/2023    3:09 PM 02/02/2023    9:11 AM  PHQ9 SCORE ONLY  PHQ-9 Total Score 13 20    Medications: Outpatient  Medications Prior to Visit  Medication Sig   busPIRone (BUSPAR) 5 MG tablet Take 1 tablet (5 mg total) by mouth 2 (two) times daily.   escitalopram (LEXAPRO) 10 MG tablet Take 1 tablet (10 mg total) by mouth daily.   No facility-administered medications prior to visit.    Review of Systems  Constitutional:  Negative for fatigue and fever.  Respiratory:  Negative for cough and shortness of breath.   Cardiovascular:  Negative for chest pain and leg swelling.  Gastrointestinal:  Negative for abdominal pain.  Neurological:  Negative for dizziness and headaches.      Objective    BP 124/78 (BP Location: Left Arm, Patient Position: Sitting, Cuff Size: Normal)   Pulse 75   Ht 5\' 2"  (1.575 m)   Wt 161 lb 12.8 oz (73.4 kg)   LMP 04/03/2023   Breastfeeding No   BMI 29.59 kg/m   Physical Exam Vitals reviewed.  Constitutional:      Appearance: She is not ill-appearing.  HENT:     Head: Normocephalic.  Eyes:     Conjunctiva/sclera: Conjunctivae normal.  Cardiovascular:     Rate and Rhythm: Normal rate.  Pulmonary:     Effort: Pulmonary effort is normal. No respiratory distress.  Neurological:     General: No focal deficit present.     Mental Status: She is alert and oriented to person, place, and time.  Psychiatric:  Mood and Affect: Mood normal.        Behavior: Behavior normal.     ***  No results found for any visits on 04/12/23.  Assessment & Plan     ***  No follow-ups on file.      {provider attestation***:1}   [name], PA-C  [facility] [phone] (phone) [phone] (fax)  [facility]

## 2023-04-13 ENCOUNTER — Encounter: Payer: Self-pay | Admitting: Physician Assistant

## 2023-04-13 NOTE — Assessment & Plan Note (Addendum)
-  Increase Lexapro from 10mg  to 20mg  daily. -Add Ativan 0.5mg  PRN for acute anxiety episodes. -Discontinue BuSpar due to lack of efficacy.

## 2023-04-16 ENCOUNTER — Ambulatory Visit (INDEPENDENT_AMBULATORY_CARE_PROVIDER_SITE_OTHER): Payer: Commercial Managed Care - PPO | Admitting: Clinical

## 2023-04-16 DIAGNOSIS — F411 Generalized anxiety disorder: Secondary | ICD-10-CM | POA: Diagnosis not present

## 2023-04-16 NOTE — Progress Notes (Addendum)
[facility] Counselor/Therapist Progress Note  Patient ID: Pamela [name], MRN: [id number]    Date: 04/16/23  Time Spent: 9:39  am - 10:13 am : 34 Minutes  Treatment Type: Individual Therapy.  Reported Symptoms: Patient reported fatigue, anxiety, and decreased motivation  Mental Status Exam: Appearance:  Neat and Well Groomed     Behavior: Appropriate  Motor: Normal  Speech/Language:  Clear and Coherent  Affect: Appropriate  Mood: anxious  Thought process: normal  Thought content:   WNL  Sensory/Perceptual disturbances:   WNL  Orientation: oriented to person, place, and situation  Attention: Good  Concentration: Good  Memory: WNL  Fund of knowledge:  Good  Insight:   Good  Judgment:  Good  Impulse Control: Good   Risk Assessment: Danger to Self:  No Patient denied current suicidal ideation  Self-injurious Behavior: No Danger to Others: No Patient denied current homicidal ideation Duty to Warn:no Physical Aggression / Violence:No  Access to Firearms a concern: No  Gang Involvement:No   Subjective:  Patient "I'd say things are about the same" in response to events since last session. Patient stated, "I'm tired", "no motivation at all", and stated, "that seems to be normal". Patient stated,  "I feel like I always have anxiety". Patient reported she experiences an increase in anxiety at the end of each month due to work related stress. Patient reported due to patient's experience in relationships she does not trust anyone. Patient stated, "I'd probably be ok with it" in response to participation in couples counseling. Patient stated, "I would be fine with that" in response to an evaluation for ADHD. Patient stated, "that's fine" in response to a consultation with a psychiatrist. Patient stated, "I might be ok with that" in response to participation in a grief/loss support group. Patient stated, "my plan is to try to figure out how to cope because I feel like I've  lost all control". Patient reported she is open to participation in therapy.   Interventions: Clinician conducted session via caregility video from clinician's home office. Patient provided verbal consent to proceed with telehealth session and is aware of limitations of telephone or video visits. Patient participated in session from patient's car. Patient's 48 year old son was present during a portion of today's session and patient provided verbal consent for patient's son to be present during today's session.  Clinician reviewed diagnosis and treatment recommendations. Provided psycho education related to diagnosis and treatment. Discussed a recommendation for patient and significant other to participate in couples counseling. Discussed a recommendation for patient to participate in a grief/loss support group for additional support.   Collaboration of Care: Other Discussed with patient consents required for referrals discussed during today's session.   Diagnosis:  Generalized anxiety disorder R/O ADHD  Plan: Goals to be developed during follow up appointment on 05/20/12.                    Doree Barthel, LCSW

## 2023-05-21 ENCOUNTER — Encounter: Payer: Self-pay | Admitting: Clinical

## 2023-05-21 NOTE — Progress Notes (Deleted)
                Karen Sharpe, LCSW 

## 2023-05-21 NOTE — Progress Notes (Signed)
This encounter was created in error - please disregard.

## 2023-05-24 ENCOUNTER — Encounter: Payer: Self-pay | Admitting: Family Medicine

## 2023-05-24 ENCOUNTER — Ambulatory Visit: Payer: Commercial Managed Care - PPO | Admitting: Family Medicine

## 2023-05-24 VITALS — BP 118/85 | HR 74 | Temp 98.6°F | Ht 65.0 in | Wt 161.0 lb

## 2023-05-24 DIAGNOSIS — F419 Anxiety disorder, unspecified: Secondary | ICD-10-CM | POA: Diagnosis not present

## 2023-05-24 DIAGNOSIS — F32A Depression, unspecified: Secondary | ICD-10-CM

## 2023-05-24 DIAGNOSIS — F39 Unspecified mood [affective] disorder: Secondary | ICD-10-CM

## 2023-05-24 MED ORDER — QUETIAPINE FUMARATE ER 150 MG PO TB24: 150.0000 mg | ORAL_TABLET | Freq: Every day | ORAL | 0 refills | Status: AC

## 2023-05-24 MED ORDER — QUETIAPINE FUMARATE ER 50 MG PO TB24: 50.0000 mg | ORAL_TABLET | Freq: Every day | ORAL | 0 refills | Status: AC

## 2023-05-24 NOTE — Assessment & Plan Note (Addendum)
-   Dosage of Lexapro increased to 20 mg at previous visit. - Positive mood disorder questionnaire screening today. - Will discontinue patient's Lexapro, tapered as indicated on patient instructions, and start patient on Seroquel as prescribed below as noted in patient instructions. - Advised patient of the importance of following up with psychiatry as well, given possible mood disorder. - Patient denies SI/SA/HI/HA; safety contract in place.

## 2023-05-24 NOTE — Patient Instructions (Signed)
Take 1/2 tablet of escitalopram for the next 4 days. Start the 50 mg daily of Seroquel (quetiapine) tomorrow. On the fifth day, take only the 50 mg of Seroquel.  On the sixth day start the 150 mg of Seroquel.

## 2023-05-24 NOTE — Progress Notes (Signed)
Established patient visit   Patient: Pamela [name]   DOB: 21-Jan-1991   32 y.o. Female  MRN: [id number] Visit Date: 05/24/2023  Today's healthcare provider: Sherlyn Hay, DO   Chief Complaint  Patient presents with   Anxiety   Subjective    HPI HPI   Patient feels the Lexapro is helping at 20 mg daily with stabilizing her mood but she feels her anxiety is still pretty bad.  She was started on Lorazepam last month but states she doesn't seem to think it has helped at all.   Last edited by Adline Peals, CMA on 05/24/2023  1:21 PM.      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 They mostly get along; they have two kids together. (31 year old female and 66 year old boy). When something goes wrong, still feels she loses control. Goes to her car cries to herself; does not have outbursts. Has tried the lorazepam during these episodes without benefit.  - episodes occur a couple times a week.  - no warning for episodes.  - does walk to help herself calm down.  Has palpitations and diaphoresis when this occurs.  - had previously taken an antidepressant for post-partum depression in 2015; does not remember which. Also had 1 mg xanax at the time.   Sees a therapist Doree Barthel); reports the sessions are going well. She was referred to a psychiatrist and is waiting on that appointment. Therapist wants her evaluated for ADHD. She was diagnosed with ADD as a child. Never took a medication that she knows of.     Medications: Outpatient Medications Prior to Visit  Medication Sig   LORazepam (ATIVAN) 0.5 MG tablet Take 1 tablet (0.5 mg total) by mouth daily as needed for anxiety.   [DISCONTINUED] escitalopram (LEXAPRO) 20 MG tablet Take 1 tablet (20 mg total) by mouth daily.   No facility-administered medications prior to visit.     Review of Systems  Constitutional:  Negative for appetite change, chills, fatigue and fever.  Respiratory:  Negative for chest tightness and shortness of breath.   Cardiovascular:  Negative for chest pain and palpitations.  Gastrointestinal:  Negative for abdominal pain, nausea and vomiting.  Neurological:  Negative for dizziness and weakness.  Psychiatric/Behavioral:  Negative for sleep disturbance. The patient is not nervous/anxious.          Objective    BP 118/85 (BP Location: Right Arm, Patient Position: Sitting, Cuff Size: Normal)   Pulse 74   Temp 98.6 F (37 C) (Oral)   Ht 5\' 5"  (1.651 m)   Wt 161 lb (73 kg)   SpO2 100%   BMI 26.79 kg/m     Physical Exam Vitals and nursing note reviewed.  Constitutional:      General: She is not in acute distress.    Appearance: Normal appearance.  HENT:     Head: Normocephalic and atraumatic.  Eyes:     General: No scleral icterus.    Conjunctiva/sclera: Conjunctivae normal.  Cardiovascular:     Rate and Rhythm: Normal rate.  Pulmonary:     Effort: Pulmonary effort is normal.  Neurological:     Mental Status: She is alert and oriented to person, place, and time. Mental status is at baseline.  Psychiatric:        Mood and Affect:  Mood normal.        Behavior: Behavior normal.      No results found for any visits on 05/24/23.  Assessment & Plan    Anxiety and depression Assessment & Plan: - Dosage of Lexapro increased to 20 mg at previous visit. - Positive mood disorder questionnaire screening today. - Will discontinue patient's Lexapro, tapered as indicated on patient instructions, and start patient on Seroquel as prescribed below as noted in patient instructions. - Advised patient of the importance of following up with psychiatry as well, given possible mood disorder. - Patient denies SI/SA/HI/HA; safety contract in place.  Orders: -     QUEtiapine Fumarate ER; Take 1 tablet (50 mg total) by mouth daily.   Dispense: 5 tablet; Refill: 0 -     QUEtiapine Fumarate ER; Take 1 tablet (150 mg total) by mouth at bedtime.  Dispense: 30 tablet; Refill: 0  Mood disorder (HCC) -     QUEtiapine Fumarate ER; Take 1 tablet (50 mg total) by mouth daily.  Dispense: 5 tablet; Refill: 0 -     QUEtiapine Fumarate ER; Take 1 tablet (150 mg total) by mouth at bedtime.  Dispense: 30 tablet; Refill: 0    Return in about 5 weeks (around 06/28/2023) for Anx/Dep.      I discussed the assessment and treatment plan with the patient  The patient was provided an opportunity to ask questions and all were answered. The patient agreed with the plan and demonstrated an understanding of the instructions.   The patient was advised to call back or seek an in-person evaluation if the symptoms worsen or if the condition fails to improve as anticipated.    Sherlyn Hay, DO  [facility] [phone] (phone) [phone] (fax)  [facility]

## 2023-06-04 ENCOUNTER — Ambulatory Visit (INDEPENDENT_AMBULATORY_CARE_PROVIDER_SITE_OTHER): Payer: Commercial Managed Care - PPO | Admitting: Clinical

## 2023-06-04 DIAGNOSIS — F411 Generalized anxiety disorder: Secondary | ICD-10-CM | POA: Diagnosis not present

## 2023-06-04 NOTE — Progress Notes (Signed)
[facility] Counselor/Therapist Progress Note  Patient ID: TOOBA MINSON, MRN: [id number]    Date: 06/04/23  Time Spent: 2:32  pm - 3:12 pm : 48 Minutes  Treatment Type: Individual Therapy.  Reported Symptoms: Patient reported feeling overwhelmed  Mental Status Exam: Appearance:  Neat and Well Groomed     Behavior: Appropriate  Motor: Normal  Speech/Language:  Clear and Coherent  Affect: Appropriate  Mood: overwhelmed  Thought process: normal  Thought content:   WNL  Sensory/Perceptual disturbances:   WNL  Orientation: oriented to person, place, and situation  Attention: Good  Concentration: Good  Memory: WNL  Fund of knowledge:  Good  Insight:   Good  Judgment:  Good  Impulse Control: Good   Risk Assessment: Danger to Self:  No Patient denied current suicidal ideation  Self-injurious Behavior: No Danger to Others: No Patient denied current homicidal ideation Duty to Warn:no Physical Aggression / Violence:No  Access to Firearms a concern: No  Gang Involvement:No   Subjective:  Patient reported the relationship with her significant other has improved and reported she does not feel they need couples counseling at this time due to improvement in their communication. Patient reported she put her two weeks notice in at her place of employment due to work related stress and increased work load. Patient reported her place of employment offered to move patient's office and offered patient a raise. Patient reported the amount of her raise will be discussed on Monday and the amount will determine if patient remains at her current place of employment. Patient stated, "its definitely been a roller coaster" and reported feeling overwhelmed. Patient reported she missed her last appointment due to her daughter's health.  Patient stated, "I don't think I have had a moment to think about it since we had talked" in response to goals for therapy. Patient stated, "I think my  anxiety getting under control" in response to potential goals for therapy.   Interventions: Motivational Interviewing. Clinician conducted session via caregility video from clinician's home office. Patient provided verbal consent to proceed with telehealth session and is aware of limitations of telephone or video visits. Patient participated in session from patient's vehicle at patient's place of employment. Reviewed recommendation of couples counseling. Reviewed events since lasts session and recent stressors. Discussed with patient recent missed appointment and provided psycho education related to [employer] [facility]'s no show/late cancel policy. Clinician utilized motivational interviewing to explore potential goals for therapy. Clinician utilized a task centered approach in collaboration with patient to develop goals for therapy. Patient participated in development of goals and agreed to goals for therapy. Clinician requested for homework patient complete a thought record.   Collaboration of Care: Other Clinician discussed with patient consent required for referral to [facility]  Diagnosis:  Generalized anxiety disorder  R/O ADHD  Plan: Patient is to utilize [employer], thought re-framing, relaxation techniques, mindfulness and coping strategies to decrease symptoms associated with their diagnosis. Frequency: bi-weekly  Modality: individual     Long-term goal:   Reduce overall level, frequency, and intensity of the feelings of anxiety as evidenced by decrease in heart rate, sweating, worry, feeling on edge, irritability, difficulty staying asleep, and difficulty concentrating from 7 days/week to 0 to 1 days/week per patient report for at least 3 consecutive months. Target Date: 06/03/24  Progress: 0   Short-term goal:  Verbalize an understanding of the role that distorted thinking plays in excessive worry and ruminations. Target Date:  12/05/23  Progress: 0   Identify triggers for feelings of anxiety and develop coping strategies to utilize in response to feelings of anxiety to reduce symptoms per patient's report  Target Date: 12/05/23  Progress: 0   Identify, challenge, and replace negative thought patterns that contribute to feelings anxiety with positive thoughts and beliefs per patient's report Target Date: 12/05/23  Progress: 0                      Doree Barthel, LCSW

## 2023-07-05 ENCOUNTER — Ambulatory Visit: Payer: Commercial Managed Care - PPO | Admitting: Family Medicine

## 2023-07-09 ENCOUNTER — Ambulatory Visit: Payer: Commercial Managed Care - PPO | Admitting: Clinical

## 2023-07-13 ENCOUNTER — Ambulatory Visit: Payer: Commercial Managed Care - PPO | Admitting: Family Medicine

## 2023-07-13 NOTE — Progress Notes (Deleted)
      Established patient visit   Patient: Pamela [name]   DOB: [birth year]   32 y.o. Female  MRN: [id number] Visit Date: 07/13/2023  Today's healthcare provider: Sherlyn Hay, DO   No chief complaint on file.  Subjective    HPI Anxiety, Follow-up  She was last seen for anxiety 6 weeks ago. Changes made at last visit include transitioned off Lexapro and onto Seroquel with plan to titrate up to 150 mg daily.   She reports {excellent/good/fair/poor:19665} compliance with treatment. She reports {good/fair/poor:18685} tolerance of treatment. She {is/is not:21021397} having side effects. {document side effects if present:1}  She feels her anxiety is {Desc; severity:60313} and {improved/worse/unchanged:3041574} since last visit.  Symptoms: {Yes/No:20286} chest pain {Yes/No:20286} difficulty concentrating  {Yes/No:20286} dizziness {Yes/No:20286} fatigue  {Yes/No:20286} feelings of losing control {Yes/No:20286} insomnia  {Yes/No:20286} irritable {Yes/No:20286} palpitations  {Yes/No:20286} panic attacks {Yes/No:20286} racing thoughts  {Yes/No:20286} shortness of breath {Yes/No:20286} sweating  {Yes/No:20286} tremors/shakes    GAD-7 Results    05/24/2023    1:24 PM 04/12/2023    1:35 PM  GAD-7 Generalized Anxiety Disorder Screening Tool  1. Feeling Nervous, Anxious, or on Edge 3 3  2. Not Being Able to Stop or Control Worrying 3 3  3. Worrying Too Much About Different Things 3 3  4. Trouble Relaxing 3 2  5. Being So Restless it's Hard To Sit Still 1 1  6. Becoming Easily Annoyed or Irritable 2 3  7. Feeling Afraid As If Something Awful Might Happen 0 0  Total GAD-7 Score 15 15  Difficulty At Work, Home, or Getting  Along With Others? Extremely difficult Very difficult    PHQ-9 Scores    05/24/2023    1:24 PM 03/10/2023    3:09 PM 02/02/2023    9:11 AM  PHQ9 SCORE ONLY  PHQ-9 Total Score 12 13 20      ---------------------------------------------------------------------------------------------------   ***  {History (Optional):23778}  Medications: Outpatient Medications Prior to Visit  Medication Sig   LORazepam (ATIVAN) 0.5 MG tablet Take 1 tablet (0.5 mg total) by mouth daily as needed for anxiety.   QUEtiapine (SEROQUEL XR) 50 MG TB24 24 hr tablet Take 1 tablet (50 mg total) by mouth daily.   QUEtiapine Fumarate (SEROQUEL XR) 150 MG 24 hr tablet Take 1 tablet (150 mg total) by mouth at bedtime.   No facility-administered medications prior to visit.    Review of Systems  ***  {Insert previous labs (optional):23779} {See past labs  Heme  Chem  Endocrine  Serology  Results Review (optional):1}   Objective    There were no vitals taken for this visit. {Insert last BP/Wt (optional):23777}{See vitals history (optional):1}   Physical Exam  ***  No results found for any visits on 07/13/23.  Assessment & Plan    There are no diagnoses linked to this encounter.   ***  No follow-ups on file.      I discussed the assessment and treatment plan with the patient  The patient was provided an opportunity to ask questions and all were answered. The patient agreed with the plan and demonstrated an understanding of the instructions.   The patient was advised to call back or seek an in-person evaluation if the symptoms worsen or if the condition fails to improve as anticipated.    Sherlyn Hay, DO  [facility] [phone] (phone) [phone] (fax)  [facility]

## 2023-07-16 ENCOUNTER — Ambulatory Visit (INDEPENDENT_AMBULATORY_CARE_PROVIDER_SITE_OTHER): Payer: Commercial Managed Care - PPO | Admitting: Clinical

## 2023-07-16 DIAGNOSIS — F411 Generalized anxiety disorder: Secondary | ICD-10-CM | POA: Diagnosis not present

## 2023-07-16 NOTE — Progress Notes (Signed)
[facility] Counselor/Therapist Progress Note  Patient ID: Pamela [name], MRN: [id number],    Date: 07/16/2023  Time Spent: 10:32am - 11:17am : 45 minutes   Treatment Type: Individual Therapy  Reported Symptoms: Patient reported recent feelings of anxiety and feeling overwhelmed  Mental Status Exam: Appearance:  Neat and Well Groomed     Behavior: Appropriate  Motor: Normal  Speech/Language:  Clear and Coherent  Affect: Tearful when discussing relationship  Mood: anxious  Thought process: normal  Thought content:   WNL  Sensory/Perceptual disturbances:   WNL  Orientation: oriented to person, place, and situation  Attention: Good  Concentration: Good  Memory: WNL  Fund of knowledge:  Good  Insight:   Good  Judgment:  Good  Impulse Control: Good   Risk Assessment: Danger to Self:  No Patient denied current suicidal ideation  Self-injurious Behavior: No Danger to Others: No Patient denied current homicidal ideation Duty to Warn:no Physical Aggression / Violence:No  Access to Firearms a concern: No  Gang Involvement:No   Subjective: Patient reported she has been provided her own office at patient's place of employment. Patient reported while patient was on vacation patient's manager called patient to let her know she was appreciated at her place of employment. Patient reported the fast pace of her work environment and patient's job responsibilities are triggers for anxiety. Patient stated, "this place will have me on edge" in regards to patient's job.  Patient stated, "that has gotten better" in regards to patient's interactions with the [employer]. Patient reported the 26th of September is the anniversary of her brother's death and the 10/05/2023is his birthday. Patient reported each year patient/mother take a trip in honor of her brother. Patient reported feeling overwhelmed when returning to work from recent trip. Patient stated, "I feel ok" in  response to patient's mood since last session. Patient reported experiencing worry on Sunday in anticipation of returning to work on Monday. Patient stated on Monday "my chest was in knots" and stated, "the rest of the week was ok". Patient stated, "just a little nervous" in response to patient's mood today. Patient stated, "the biggest one for me was work" in regards to patient's thought record entries. Patient reported the following thoughts associated with her job: "I'm not good enough", "what if something is wrong". Patient stated, "I feel like everything is always my fault with him (significant other)" in response to patient's relationship with her significant other. Patient reported she is open to participation in couples counseling and reported she feels significant other would be open to participating.   Interventions: Cognitive Behavioral Therapy. Clinician conducted session via caregility video from clinician's home office. Patient provided verbal consent to proceed with telehealth session and is aware of limitations of telephone or video visits. Patient participated in session from patient's vehicle at patient's place of employment.  Reviewed the status of recent stressors. Assisted patient in exploring and identifying triggers for anxiety. Assessed patient's mood since last session and assessed patient's current mood. Reviewed patient's thought record entries. Assisted patient in discussing and identifying thoughts/feelings triggered by patient's job and patient's relationship with significant other. Discussed recommendation for couples counseling. Provided psycho education related to cognitive distortions, cognitive restructuring, triggers and the impact on mood. Provided psycho education related to challenging cognitive distortions using socratic questions. Clinician requested for homework patient continue thought record and practice identifying evidence for/against cognitive distortions/negative  thoughts.     Collaboration of Care: Other not required at this  time   Diagnosis:  Generalized anxiety disorder  R/O ADHD   Plan: Patient is to utilize [employer] Therapy, thought re-framing, relaxation techniques, mindfulness and coping strategies to decrease symptoms associated with their diagnosis. Frequency: bi-weekly  Modality: individual      Long-term goal:   Reduce overall level, frequency, and intensity of the feelings of anxiety as evidenced by decrease in heart rate, sweating, worry, feeling on edge, irritability, difficulty staying asleep, and difficulty concentrating from 7 days/week to 0 to 1 days/week per patient report for at least 3 consecutive months. Target Date: 06/03/24  Progress: progressing    Short-term goal:  Verbalize an understanding of the role that distorted thinking plays in excessive worry and ruminations. Target Date: 12/05/23  Progress: progressing    Identify triggers for feelings of anxiety and develop coping strategies to utilize in response to feelings of anxiety to reduce symptoms per patient's report  Target Date: 12/05/23  Progress: progressing    Identify, challenge, and replace negative thought patterns that contribute to feelings anxiety with positive thoughts and beliefs per patient's report Target Date: 12/05/23  Progress: progressing                                  Doree Barthel, LCSW

## 2023-07-16 NOTE — Progress Notes (Signed)
                Dezi Schaner, LCSW 

## 2023-07-23 ENCOUNTER — Ambulatory Visit: Payer: Commercial Managed Care - PPO | Admitting: Clinical

## 2023-07-23 DIAGNOSIS — F411 Generalized anxiety disorder: Secondary | ICD-10-CM | POA: Diagnosis not present

## 2023-07-23 NOTE — Progress Notes (Signed)
[facility] Counselor/Therapist Progress Note  Patient ID: Pamela [name], MRN: [id number],    Date: 07/23/2023  Time Spent: 2:34pm - 3:17pm : 43 minutes  Treatment Type: Individual Therapy  Reported Symptoms: Patient reported feeling overwhelmed recently   Mental Status Exam: Appearance:  Neat and Well Groomed     Behavior: Appropriate  Motor: Normal  Speech/Language:  Clear and Coherent  Affect: Appropriate  Mood: normal  Thought process: normal  Thought content:   WNL  Sensory/Perceptual disturbances:   WNL  Orientation: oriented to person, place, and situation  Attention: Good  Concentration: Good  Memory: WNL  Fund of knowledge:  Good  Insight:   Good  Judgment:  Good  Impulse Control: Good   Risk Assessment: Danger to Self:  No Patient denied current suicidal ideation  Self-injurious Behavior: No Danger to Others: No Patient denied current homicidal ideation Duty to Warn:no Physical Aggression / Violence:No  Access to Firearms a concern: No  Gang Involvement:No   Subjective: Patient stated, "they're ok" in response to events since last session. Patient stated,"I have not done a whole lot of homework". Patient stated, "I got the flu Friday" and reported she did not return to work until Wednesday. Patient reported feeling overwhelmed after returning to work from vacation, closing out the month at work, and being ill from the flu. Patient stated, "I have not done a whole lot of anything" since last session. Patient stated, "I'm good today" in response to patient's mood. Patient reported feeling tired due to recovery from the flu. Patient stated, "we are all down with it" in response to patient/significant other participating in couples counseling. Patient reported she feels her daughter could benefit from therapy. Patient reported her daughter is critical of herself, feels she is not good enough, worries, has a negative perspective, and patient stated "she  cries all the time". Patient stated, "I feel like I do projected it" in reference to patient not feeling good enough and daughter feeling the same. Patient reported her daughter has indicated she is open to participation in therapy.  Interventions: Cognitive Behavioral Therapy and supportive therapy .  Clinician conducted session via caregility video from clinician's home office. Patient provided verbal consent to proceed with telehealth session and is aware of limitations of telephone or video visits. Patient participated in session from patient's vehicle at patient's place of employment. Reviewed events since last session. Reviewed patient's homework and barriers to completing homework assignment. Provided supportive therapy, active listening, and validation as patient discussed recent illness and patient's concerns related to her daughter's mental health. Assessed patient's mood since last session and assessed patient's current mood. Reviewed recommendation for couples counseling and patient/significant others response. Discussed and provided therapy resources for patient's daughter. Clinician requested for homework patient continue thought record and practice identifying evidence for/against cognitive distortions/negative thoughts.      Collaboration of Care: Other not required at this time   Diagnosis:  Generalized anxiety disorder  R/O ADHD   Plan: Patient is to utilize [employer] Therapy, thought re-framing, relaxation techniques, mindfulness and coping strategies to decrease symptoms associated with their diagnosis. Frequency: bi-weekly  Modality: individual      Long-term goal:   Reduce overall level, frequency, and intensity of the feelings of anxiety as evidenced by decrease in heart rate, sweating, worry, feeling on edge, irritability, difficulty staying asleep, and difficulty concentrating from 7 days/week to 0 to 1 days/week per patient report for at least 3 consecutive  months. Target Date: 06/03/24  Progress: progressing    Short-term goal:  Verbalize an understanding of the role that distorted thinking plays in excessive worry and ruminations. Target Date: 12/05/23  Progress: progressing    Identify triggers for feelings of anxiety and develop coping strategies to utilize in response to feelings of anxiety to reduce symptoms per patient's report  Target Date: 12/05/23  Progress: progressing    Identify, challenge, and replace negative thought patterns that contribute to feelings anxiety with positive thoughts and beliefs per patient's report Target Date: 12/05/23  Progress: progressing                            Doree Barthel, LCSW

## 2023-07-23 NOTE — Progress Notes (Signed)
                Dezi Schaner, LCSW 

## 2023-08-13 ENCOUNTER — Ambulatory Visit: Payer: Commercial Managed Care - PPO | Admitting: Clinical

## 2023-09-03 ENCOUNTER — Encounter: Payer: Commercial Managed Care - PPO | Admitting: Clinical

## 2023-09-03 NOTE — Progress Notes (Signed)
Doree Barthel, LCSWThis encounter was created in error - please disregard.

## 2024-10-08 DIAGNOSIS — R42 Dizziness and giddiness: Secondary | ICD-10-CM | POA: Insufficient documentation

## 2024-10-08 DIAGNOSIS — E876 Hypokalemia: Secondary | ICD-10-CM | POA: Insufficient documentation

## 2024-10-08 DIAGNOSIS — R519 Headache, unspecified: Secondary | ICD-10-CM | POA: Insufficient documentation

## 2024-10-08 NOTE — ED Triage Notes (Signed)
 Patient here from home with reports of a migraine for the last day along with dizziness that started at 6 pm tonight. She states when she closes her eyes she feels like the whole world is spinning. She had a brief episode of dizziness yesterday as well but it subsided. She states she feels like her head is about to pop. This feels worse than her typical migraine. Endorses photosensitivity, nausea. Reports clamminess, poor appetite, chest tightness while feeling dizzy.

## 2024-10-09 ENCOUNTER — Other Ambulatory Visit: Payer: Self-pay

## 2024-10-09 ENCOUNTER — Emergency Department: Payer: Self-pay

## 2024-10-09 ENCOUNTER — Emergency Department
Admission: EM | Admit: 2024-10-09 | Discharge: 2024-10-09 | Disposition: A | Payer: Self-pay | Attending: Emergency Medicine | Admitting: Emergency Medicine

## 2024-10-09 ENCOUNTER — Encounter: Payer: Self-pay | Admitting: Emergency Medicine

## 2024-10-09 DIAGNOSIS — E876 Hypokalemia: Secondary | ICD-10-CM

## 2024-10-09 DIAGNOSIS — R519 Headache, unspecified: Secondary | ICD-10-CM

## 2024-10-09 DIAGNOSIS — R42 Dizziness and giddiness: Secondary | ICD-10-CM

## 2024-10-09 LAB — BASIC METABOLIC PANEL WITH GFR
Anion gap: 13 (ref 5–15)
BUN: 11 mg/dL (ref 6–20)
CO2: 23 mmol/L (ref 22–32)
Calcium: 9.2 mg/dL (ref 8.9–10.3)
Chloride: 105 mmol/L (ref 98–111)
Creatinine, Ser: 0.73 mg/dL (ref 0.44–1.00)
GFR, Estimated: >60 mL/min
Glucose, Bld: 98 mg/dL (ref 70–99)
Potassium: 3 mmol/L — ABNORMAL LOW (ref 3.5–5.1)
Sodium: 140 mmol/L (ref 135–145)

## 2024-10-09 LAB — CBC
HCT: 43 % (ref 36.0–46.0)
Hemoglobin: 14.1 g/dL (ref 12.0–15.0)
MCH: 30.1 pg (ref 26.0–34.0)
MCHC: 32.8 g/dL (ref 30.0–36.0)
MCV: 91.9 fL (ref 80.0–100.0)
Platelets: 213 K/uL (ref 150–400)
RBC: 4.68 MIL/uL (ref 3.87–5.11)
RDW: 13.2 % (ref 11.5–15.5)
WBC: 7.3 K/uL (ref 4.0–10.5)
nRBC: 0 % (ref 0.0–0.2)

## 2024-10-09 LAB — RESP PANEL BY RT-PCR (RSV, FLU A&B, COVID)  RVPGX2
Influenza A by PCR: NEGATIVE
Influenza B by PCR: NEGATIVE
Resp Syncytial Virus by PCR: NEGATIVE
SARS Coronavirus 2 by RT PCR: NEGATIVE

## 2024-10-09 LAB — TROPONIN T, HIGH SENSITIVITY: Troponin T High Sensitivity: <15 ng/L (ref 0–19)

## 2024-10-09 MED ORDER — POTASSIUM CHLORIDE 20 MEQ PO PACK
40.0000 meq | PACK | Freq: Once | ORAL | Status: AC
  Administered 2024-10-09: 40 meq via ORAL
  Filled 2024-10-09: qty 2

## 2024-10-09 MED ORDER — MECLIZINE HCL 25 MG PO TABS: 25.0000 mg | ORAL_TABLET | Freq: Three times a day (TID) | ORAL | 0 refills | Status: AC | PRN

## 2024-10-09 MED ORDER — MECLIZINE HCL 25 MG PO TABS
25.0000 mg | ORAL_TABLET | Freq: Once | ORAL | Status: AC
  Administered 2024-10-09: 25 mg via ORAL
  Filled 2024-10-09: qty 1

## 2024-10-09 MED ORDER — KETOROLAC TROMETHAMINE 30 MG/ML IJ SOLN
30.0000 mg | Freq: Once | INTRAMUSCULAR | Status: AC
  Administered 2024-10-09: 30 mg via INTRAMUSCULAR
  Filled 2024-10-09: qty 1

## 2024-10-09 NOTE — ED Provider Notes (Signed)
 "  [facility], [place] Provider Note    Event Date/Time   First MD Initiated Contact with Patient 10/09/24 [phone]     (approximate)   History   Migraine and Dizziness   HPI  Pamela [name] is a 33 y.o. female past medical history significant for anxiety and depression, who presents to the emergency department with headache and dizziness.  Patient states that she has a long history of migraine headaches and headaches.  Gets headaches on a regular basis.  States that her headache was slightly worse over the past 2 days and the main reason she came into the emergency department for dizziness.  States that when she closes her eyes she feels like the room is spinning.  States that it is worse with head movement.  No trouble with ambulation or her gait.  No change in vision, slurring of speech, trouble swallowing or extremity numbness or weakness.  No recent falls or head trauma.  No neck manipulation.  No ringing in her ears or change in hearing.  Denies further episodes of dizziness in the past.  No recent travel.  No recent illness.  Not the worst headache of her life.  Not sudden onset.  Feels like a pressure sensation to the top of her head.  No fever or chills.  No neck stiffness or pain.  Also mentions to triage mild chest pressure -denies any active chest pain at this time.  No significant cough.  States that she has been having increased rest over the past couple of days since her significant other's parents passed away.     Physical Exam   Triage Vital Signs: ED Triage Vitals  Encounter Vitals Group     BP 10/09/24 0000 (!) 138/95     Girls Systolic BP Percentile --      Girls Diastolic BP Percentile --      Boys Systolic BP Percentile --      Boys Diastolic BP Percentile --      Pulse Rate 10/09/24 0000 85     Resp 10/09/24 0000 18     Temp 10/09/24 0002 98 F (36.7 C)     Temp src --      SpO2 10/09/24 0000 100 %     Weight 10/09/24 0001 160 lb 15 oz (73 kg)      Height 10/09/24 0001 5' 5 (1.651 m)     Head Circumference --      Peak Flow --      Pain Score 10/09/24 0001 10     Pain Loc --      Pain Education --      Exclude from Growth Chart --     Most recent vital signs: Vitals:   10/09/24 0327 10/09/24 0740  BP: 111/80 111/79  Pulse: 72 70  Resp: 17 16  Temp: 98.1 F (36.7 C) 97.9 F (36.6 C)  SpO2: 99% 100%    Physical Exam Constitutional:      Appearance: She is well-developed.  HENT:     Head: Atraumatic.  Eyes:     Extraocular Movements: Extraocular movements intact.     Conjunctiva/sclera: Conjunctivae normal.     Pupils: Pupils are equal, round, and reactive to light.     Comments: Reproducible dizziness with Dix-Hallpike maneuver but no nystagmus present  Cardiovascular:     Rate and Rhythm: Regular rhythm.  Pulmonary:     Effort: No respiratory distress.  Abdominal:     General: There is  no distension.  Musculoskeletal:        General: Normal range of motion.     Cervical back: Normal range of motion.  Skin:    General: Skin is warm.  Neurological:     Mental Status: She is alert. Mental status is at baseline.     GCS: GCS eye subscore is 4. GCS verbal subscore is 5. GCS motor subscore is 6.     Cranial Nerves: Cranial nerves 2-12 are intact.     Sensory: Sensation is intact.     Motor: Motor function is intact.     Coordination: Coordination is intact.     Gait: Gait is intact.     IMPRESSION / MDM / ASSESSMENT AND PLAN / ED COURSE  I reviewed the triage vital signs and the nursing notes.  Differential diagnosis including complex migraine, vertigo, BPPV, tension headache, analgesia rebound headache  Low suspicion for meningitis, no neck stiffness, no altered mental status or fever  No neck manipulation and no ongoing vertiginous symptoms have a low suspicion for central cause of vertigo, doubt dissection.  No sudden onset of headache, not with worst headache of her life, doubt subarachnoid  hemorrhage and has a normal neurologic exam no nystagmus on my exam normal gait.   EKG  I, Clotilda Punter, the attending physician, personally viewed and interpreted this ECG.   Rate: Normal  Rhythm: Normal sinus  Axis: Normal  Intervals: Normal  ST&T Change: None  No tachycardic or bradycardic dysrhythmias while on cardiac telemetry.  RADIOLOGY Chest x-ray read as no acute findings LABS (all labs ordered are listed, but only abnormal results are displayed) Labs interpreted as -    Labs Reviewed  BASIC METABOLIC PANEL WITH GFR - Abnormal; Notable for the following components:      Result Value   Potassium 3.0 (*)    All other components within normal limits  RESP PANEL BY RT-PCR (RSV, FLU A&B, COVID)  RVPGX2  CBC  POC URINE PREG, ED  TROPONIN T, HIGH SENSITIVITY     MDM  Lab work with no significant leukocytosis, mild hypokalemia at 3.0.  Creatinine at baseline with no significant other electrolyte abnormalities.  COVID influenza testing are negative.  Troponin is negative have low suspicion for ACS, low risk heart score no active chest pain at this time.  Chest x-ray with no signs of pneumonia.  Patient given potassium repletion.  Given Toradol  and meclizine .  Plan for reevaluation.  Clinical Course as of 10/09/24 0816  Mon Oct 09, 2024  0806 On reevaluation patient states she is feeling significantly better.  No longer with dizziness or room spinning.  States that her headache is feeling better.  Unable to provide a urine sample but states that she has very little concerned that she is pregnant given that she has had a tubal ligation.  Discussed IV medication and patient states that she is feeling better wants to go home.  Given information to follow-up as an outpatient with neurology.  Concern for tension headache versus complex migraine versus a possible peripheral vertigo.  Will give a prescription for meclizine  and discussed Epley maneuvers.  Takes near daily ibuprofen   and discussed possible analgesic rebound headaches.  Discussed return precautions. [SM]    Clinical Course User Index [SM] Punter Clotilda, MD     PROCEDURES:  Critical Care performed: No  Procedures  Patient's presentation is most consistent with acute presentation with potential threat to life or bodily function.   MEDICATIONS ORDERED IN  ED: Medications  ketorolac  (TORADOL ) 30 MG/ML injection 30 mg (30 mg Intramuscular Given 10/09/24 0747)  meclizine  (ANTIVERT ) tablet 25 mg (25 mg Oral Given 10/09/24 0744)  potassium chloride  (KLOR-CON ) packet 40 mEq (40 mEq Oral Given 10/09/24 0744)    FINAL CLINICAL IMPRESSION(S) / ED DIAGNOSES   Final diagnoses:  Vertigo  Acute nonintractable headache, unspecified headache type  Hypokalemia     Rx / DC Orders   ED Discharge Orders          Ordered    meclizine  (ANTIVERT ) 25 MG tablet  3 times daily PRN        10/09/24 0727             Note:  This document was prepared using Dragon voice recognition software and may include unintentional dictation errors.   Suzanne Kirsch, MD 10/09/24 [phone]  "

## 2024-10-09 NOTE — Discharge Instructions (Signed)
 You are seen in the emergency department for headache and dizziness.  Concerned that you could have an episode of vertigo.  You were given information of how to perform Epley maneuvers, perform a couple of times in the morning, afternoon and nighttime.  You are given a prescription for meclizine .  This can be taken as needed for dizziness.  Stay hydrated and drink plenty of fluids.  Take Tylenol  as needed for headaches.  You can try an over-the-counter Lidoderm  patch to your neck and see if this improves your headaches.  You are given information to follow-up with neurology.  Return to the emergency department for any ongoing or worsening symptoms.  Your potassium was mildly low when checked today.  Make sure to follow up with a primary doctor to follow up your labs.  Make sure to eat food high in potassium and magnesium - examples - potatoes, spinach, bananas, beans, avocadoes, oranges, nuts.
